# Patient Record
Sex: Male | Born: 1985 | Race: Black or African American | Hispanic: No | Marital: Single | Smoking: Never smoker
Health system: Southern US, Community
[De-identification: ages and names within clinical notes are randomized; demographics above are authoritative.]

## PROBLEM LIST (undated history)

## (undated) DIAGNOSIS — R569 Unspecified convulsions: Secondary | ICD-10-CM

---

## 2010-05-02 ENCOUNTER — Emergency Department (HOSPITAL_COMMUNITY): Admission: EM | Admit: 2010-05-02 | Discharge: 2010-05-02 | Payer: Self-pay | Admitting: Emergency Medicine

## 2010-05-03 ENCOUNTER — Emergency Department (HOSPITAL_COMMUNITY): Admission: EM | Admit: 2010-05-03 | Discharge: 2010-05-03 | Payer: Self-pay | Admitting: Family Medicine

## 2011-01-21 ENCOUNTER — Emergency Department (HOSPITAL_COMMUNITY)
Admission: EM | Admit: 2011-01-21 | Discharge: 2011-01-22 | Disposition: A | Discharge status: Home / Self Care | Payer: Self-pay | Attending: Emergency Medicine | Admitting: Emergency Medicine

## 2011-01-21 ENCOUNTER — Emergency Department (HOSPITAL_COMMUNITY): Payer: Self-pay

## 2011-01-21 DIAGNOSIS — S335XXA Sprain of ligaments of lumbar spine, initial encounter: Secondary | ICD-10-CM | POA: Insufficient documentation

## 2011-01-21 DIAGNOSIS — M5430 Sciatica, unspecified side: Secondary | ICD-10-CM | POA: Insufficient documentation

## 2011-01-21 DIAGNOSIS — X58XXXA Exposure to other specified factors, initial encounter: Secondary | ICD-10-CM | POA: Insufficient documentation

## 2011-01-21 DIAGNOSIS — Y929 Unspecified place or not applicable: Secondary | ICD-10-CM | POA: Insufficient documentation

## 2011-02-14 LAB — URINALYSIS, ROUTINE W REFLEX MICROSCOPIC
Bilirubin Urine: NEGATIVE
Glucose, UA: NEGATIVE mg/dL
Hgb urine dipstick: NEGATIVE
Nitrite: NEGATIVE
Protein, ur: NEGATIVE mg/dL
Specific Gravity, Urine: 1.006 (ref 1.005–1.030)

## 2011-02-14 LAB — CBC
MCV: 82.4 fL (ref 78.0–100.0)
RBC: 5.45 MIL/uL (ref 4.22–5.81)
WBC: 3.5 10*3/uL — ABNORMAL LOW (ref 4.0–10.5)

## 2011-02-14 LAB — DIFFERENTIAL
Basophils Relative: 0 % (ref 0–1)
Neutro Abs: 2.4 10*3/uL (ref 1.7–7.7)
Neutrophils Relative %: 69 % (ref 43–77)

## 2011-02-14 LAB — COMPREHENSIVE METABOLIC PANEL
ALT: 13 U/L (ref 0–53)
Albumin: 4.5 g/dL (ref 3.5–5.2)
Alkaline Phosphatase: 41 U/L (ref 39–117)
BUN: 7 mg/dL (ref 6–23)
CO2: 25 mEq/L (ref 19–32)
Calcium: 9.5 mg/dL (ref 8.4–10.5)
Creatinine, Ser: 0.95 mg/dL (ref 0.4–1.5)
GFR calc non Af Amer: >60 mL/min (ref >60)
Total Bilirubin: 2.7 mg/dL — ABNORMAL HIGH (ref 0.3–1.2)
Total Protein: 7.6 g/dL (ref 6.0–8.3)

## 2012-03-07 ENCOUNTER — Emergency Department (HOSPITAL_BASED_OUTPATIENT_CLINIC_OR_DEPARTMENT_OTHER)
Admission: EM | Admit: 2012-03-07 | Discharge: 2012-03-07 | Disposition: A | Discharge status: Home / Self Care | Payer: Self-pay | Attending: Emergency Medicine | Admitting: Emergency Medicine

## 2012-03-07 ENCOUNTER — Emergency Department (INDEPENDENT_AMBULATORY_CARE_PROVIDER_SITE_OTHER): Payer: Self-pay

## 2012-03-07 ENCOUNTER — Encounter (HOSPITAL_BASED_OUTPATIENT_CLINIC_OR_DEPARTMENT_OTHER): Payer: Self-pay | Admitting: Emergency Medicine

## 2012-03-07 DIAGNOSIS — R079 Chest pain, unspecified: Secondary | ICD-10-CM

## 2012-03-07 DIAGNOSIS — R531 Weakness: Secondary | ICD-10-CM

## 2012-03-07 DIAGNOSIS — R42 Dizziness and giddiness: Secondary | ICD-10-CM | POA: Insufficient documentation

## 2012-03-07 DIAGNOSIS — R0609 Other forms of dyspnea: Secondary | ICD-10-CM

## 2012-03-07 DIAGNOSIS — R5381 Other malaise: Secondary | ICD-10-CM | POA: Insufficient documentation

## 2012-03-07 DIAGNOSIS — R209 Unspecified disturbances of skin sensation: Secondary | ICD-10-CM | POA: Insufficient documentation

## 2012-03-07 LAB — DIFFERENTIAL
Basophils Relative: 0 % (ref 0–1)
Eosinophils Absolute: 0 10*3/uL (ref 0.0–0.7)
Lymphs Abs: 0.6 10*3/uL — ABNORMAL LOW (ref 0.7–4.0)
Monocytes Absolute: 0.5 10*3/uL (ref 0.1–1.0)
Neutrophils Relative %: 87 % — ABNORMAL HIGH (ref 43–77)

## 2012-03-07 LAB — RAPID URINE DRUG SCREEN, HOSP PERFORMED
Benzodiazepines: NOT DETECTED
Cocaine: NOT DETECTED
Tetrahydrocannabinol: NOT DETECTED

## 2012-03-07 LAB — CBC: HCT: 42.2 % (ref 39.0–52.0)

## 2012-03-07 LAB — BASIC METABOLIC PANEL
BUN: 12 mg/dL (ref 6–23)
CO2: 24 mEq/L (ref 19–32)
Calcium: 9.9 mg/dL (ref 8.4–10.5)
Creatinine, Ser: 1 mg/dL (ref 0.50–1.35)
GFR calc non Af Amer: >90 mL/min (ref >90)

## 2012-03-07 MED ORDER — SODIUM CHLORIDE 0.9 % IV BOLUS (SEPSIS)
1000.0000 mL | Freq: Once | INTRAVENOUS | Status: AC
  Administered 2012-03-07: 1000 mL via INTRAVENOUS

## 2012-03-07 NOTE — Discharge Instructions (Signed)
Near-Syncope Near-syncope is sudden weakness, dizziness, or feeling like you might pass out (faint). This may occur when getting up after sitting or while standing for a long period of time. Near-syncope can be caused by a drop in blood pressure. This is a common reaction, but it may occur to a greater degree in people taking medicines to control their blood pressure. Fainting often occurs when the blood pressure or pulse is too low to provide enough blood flow to the brain to keep you conscious. Fainting and near-syncope are not usually due to serious medical problems. However, certain people should be more cautious in the event of near-syncope, including elderly patients, patients with diabetes, and patients with a history of heart conditions (especially irregular rhythms).  CAUSES   Drop in blood pressure.   Physical pain.   Dehydration.   Heat exhaustion.   Emotional distress.   Low blood sugar.   Internal bleeding.   Heart and circulatory problems.   Infections.  SYMPTOMS   Dizziness.   Feeling sick to your stomach (nauseous).   Nearly fainting.   Body numbness.   Turning pale.   Tunnel vision.   Weakness.  HOME CARE INSTRUCTIONS   Lie down right away if you start feeling like you might faint. Breathe deeply and steadily. Wait until all the symptoms have passed. Most of these episodes last only a few minutes. You may feel tired for several hours.   Drink enough fluids to keep your urine clear or pale yellow.   If you are taking blood pressure or heart medicine, get up slowly, taking several minutes to sit and then stand. This can reduce dizziness that is caused by a drop in blood pressure.  SEEK IMMEDIATE MEDICAL CARE IF:   You have a severe headache.   Unusual pain develops in the chest, abdomen, or back.   There is bleeding from the mouth or rectum, or you have black or tarry stool.   An irregular heartbeat or a very rapid pulse develops.   You have  repeated fainting or seizure-like jerking during an episode.   You faint when sitting or lying down.   You develop confusion.   You have difficulty walking.   Severe weakness develops.   Vision problems develop.  MAKE SURE YOU:   Understand these instructions.   Will watch your condition.   Will get help right away if you are not doing well or get worse.  Document Released: 11/14/2005 Document Revised: 11/03/2011 Document Reviewed: 12/31/2010 Exit[employer] Patient Information 2012 [employer]. 

## 2012-03-07 NOTE — ED Notes (Signed)
Pt reports generalized weakness and difficulty breathing that started while at school.

## 2012-03-07 NOTE — ED Provider Notes (Signed)
History     CSN: [id number]  Arrival date & time 03/07/12  1352   First MD Initiated Contact with Patient 03/07/12 1354      Chief Complaint  Patient presents with  . 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  He notes that he started to feel "not comfortable" and felt weak and lightheaded.  He felt like he couldn't sit in his chair.  Because of his lightheadedness he began to cause mother.  He states he then dropped the phone during the phone call and was unable to pick it up for several minutes.  He never lost consciousness.  He is aware of what was going on but felt he couldn't move his hands and that he is tingling in his hands now.  He was able to get to a security guard for help and 911 was called.  Patient denies any pain but notes tingling in his head and his hands.  There Is no focal weakness.  Patient denies any drug use or substance abuse.  He notes a history of anxiety attacks but states this is much more severe than prior anxiety attacks.  He has no chest pain or shortness of breath but as noted in the room to be hyperventilating.  Patient states prior to this episode he had been well.  Patient is a 26 y.o. male presenting with weakness. The history is provided by the patient. No language interpreter was used.  Weakness The primary symptoms include dizziness and paresthesias. Primary symptoms do not include headaches, syncope, loss of consciousness, seizures, visual change, focal weakness, loss of sensation, speech change, memory loss, fever, nausea or vomiting. The symptoms began less than 1 hour ago. The symptoms are improving.  Dizziness also occurs with weakness. Dizziness does not occur with nausea or vomiting.   Additional symptoms include weakness.    History reviewed. No pertinent past medical history.  History  reviewed. No pertinent past surgical history.  No family history on file.  History  Substance Use Topics  . Smoking status: Never Smoker   . Smokeless tobacco: Not on file  . Alcohol Use: No      Review of Systems  Constitutional: Negative for fever and chills.  HENT: Negative.   Eyes: Negative.  Negative for discharge and redness.  Respiratory: Negative.  Negative for cough and shortness of breath.   Cardiovascular: Negative.  Negative for chest pain and syncope.  Gastrointestinal: Negative.  Negative for nausea, vomiting and abdominal pain.  Genitourinary: Negative.  Negative for hematuria.  Musculoskeletal: Negative.  Negative for back pain.  Skin: Negative.  Negative for color change and rash.  Neurological: Positive for dizziness, weakness, light-headedness and paresthesias. Negative for speech change, focal weakness, seizures, loss of consciousness, syncope and headaches.  Hematological: Negative.  Negative for adenopathy.  Psychiatric/Behavioral: Negative.  Negative for memory loss and confusion.  All other systems reviewed and are negative.    Allergies  Review of patient's allergies indicates no known allergies.  Home Medications  No current outpatient prescriptions on file.  BP 156/99  Pulse 82  Temp(Src) 97.9 F (36.6 C) (Oral)  Resp 24  Ht 6' (1.829 m)  Wt 190 lb (86.183 kg)  BMI 25.77 kg/m2  SpO2 100%  Physical Exam  Nursing note and vitals reviewed. Constitutional: He is oriented to person, place, and time. He appears well-developed and well-nourished.  Non-toxic appearance. He does not have a sickly appearance.       Patient appears anxious but is able to speak in simple sentences although he appears to be breathing rapidly but taking large deep inspirations  HENT:  Head: Normocephalic and atraumatic.  Eyes: Conjunctivae, EOM and lids are normal. Pupils are equal, round, and reactive to light. No scleral icterus.  Neck: Trachea normal, normal range  of motion and full passive range of motion without pain. Neck supple. No tracheal deviation present.  Cardiovascular: Normal rate, regular rhythm and normal heart sounds.  Exam reveals no gallop and no friction rub.   No murmur heard. Pulmonary/Chest: Breath sounds normal. No respiratory distress.       Patient is taking rapid deep breaths  Abdominal: Soft. Normal appearance. He exhibits no distension. There is no tenderness. There is no rebound and no CVA tenderness.  Musculoskeletal: Normal range of motion. He exhibits no edema and no tenderness.  Lymphadenopathy:    He has no cervical adenopathy.  Neurological: He is alert and oriented to person, place, and time. He has normal strength.  Skin: Skin is warm, dry and intact. No rash noted. He is not diaphoretic.  Psychiatric: He has a normal mood and affect. His behavior is normal. Judgment and thought content normal.    ED Course  Procedures (including critical care time)  Results for orders placed during the hospital encounter of 03/07/12  CBC      Component Value Range   WBC 8.4  4.0 - 10.5 (K/uL)   RBC 5.40  4.22 - 5.81 (MIL/uL)   Hemoglobin 14.3  13.0 - 17.0 (g/dL)   HCT 16.1  09.6 - 04.5 (%)   MCV 78.1  78.0 - 100.0 (fL)   MCH 26.5  26.0 - 34.0 (pg)   MCHC 33.9  30.0 - 36.0 (g/dL)   RDW 40.9  81.1 - 91.4 (%)   Platelets 239  150 - 400 (K/uL)  DIFFERENTIAL      Component Value Range   Neutrophils Relative 87 (*) 43 - 77 (%)   Neutro Abs 7.3  1.7 - 7.7 (K/uL)   Lymphocytes Relative 7 (*) 12 - 46 (%)   Lymphs Abs 0.6 (*) 0.7 - 4.0 (K/uL)   Monocytes Relative 6  3 - 12 (%)   Monocytes Absolute 0.5  0.1 - 1.0 (K/uL)   Eosinophils Relative 1  0 - 5 (%)   Eosinophils Absolute 0.0  0.0 - 0.7 (K/uL)   Basophils Relative 0  0 - 1 (%)   Basophils Absolute 0.0  0.0 - 0.1 (K/uL)  BASIC METABOLIC PANEL      Component Value Range   Sodium 139  135 - 145 (mEq/L)   Potassium 3.8  3.5 - 5.1 (mEq/L)   Chloride 103  96 - 112 (mEq/L)     CO2 24  19 - 32 (mEq/L)   Glucose, Bld 92  70 - 99 (mg/dL)   BUN 12  6 - 23 (mg/dL)   Creatinine, Ser 7.82  0.50 - 1.35 (mg/dL)   Calcium 9.9  8.4 - 95.6 (mg/dL)   GFR calc non Af Amer >90  >90 (mL/min)   GFR calc Af Amer >90  >90 (mL/min)   Dg Chest 2 View  03/07/2012  *RADIOLOGY REPORT*  Clinical Data: Generalized weakness.  Difficulty breathing.  CHEST - 2 VIEW  Comparison: None.  Findings: Heart size is normal.  Mediastinal shadows are normal. Lungs are clear.  No effusions.  No bony abnormalities.  IMPRESSION: Normal chest  Original Report Authenticated By: Thomasenia Sales, M.D.      Date: 03/07/2012  Rate: 83  Rhythm: normal sinus rhythm  QRS Axis: normal  Intervals: normal  ST/T Wave abnormalities: normal  Conduction Disutrbances:Incomplete right bundle branch block  Narrative Interpretation:   Old EKG Reviewed: none available     MDM  The patient with unclear etiology for his generalized weakness.  He denies any drug use or alcohol use at this time.  He has normal laboratory studies, normal x-ray and a normal EKG here today.  After 1 L of fluids in a short period of observation the patient feels completely better and improved and appears well at this time.  Patient may have had a vasovagal episode although is unclear.  There may be a possible psychiatric component to this as the patient does note some history of anxiety and anxiety attacks.  As the patient is otherwise healthy has normal labs, vital signs and EKG and appears well at this time without symptoms consistent for dysrhythmia, seizure or other acute neurologic deficit I feel the patient is safe for discharge home.        Nat Christen, MD 03/07/12 [phone]

## 2012-03-07 NOTE — ED Notes (Signed)
Pt attempting to void at present.

## 2013-03-16 ENCOUNTER — Emergency Department (HOSPITAL_COMMUNITY)
Admission: EM | Admit: 2013-03-16 | Discharge: 2013-03-16 | Disposition: A | Discharge status: Home / Self Care | Payer: Self-pay | Source: Home / Self Care | Attending: Emergency Medicine | Admitting: Emergency Medicine

## 2013-03-16 ENCOUNTER — Encounter (HOSPITAL_COMMUNITY): Payer: Self-pay | Admitting: *Deleted

## 2013-03-16 DIAGNOSIS — M26629 Arthralgia of temporomandibular joint, unspecified side: Secondary | ICD-10-CM

## 2013-03-16 DIAGNOSIS — R51 Headache: Secondary | ICD-10-CM

## 2013-03-16 MED ORDER — TRAMADOL HCL 50 MG PO TABS: 50.0000 mg | ORAL_TABLET | Freq: Four times a day (QID) | ORAL | Status: DC | PRN

## 2013-03-16 MED ORDER — DIAZEPAM 5 MG PO TABS: 5.0000 mg | ORAL_TABLET | Freq: Three times a day (TID) | ORAL | Status: DC | PRN

## 2013-03-16 MED ORDER — KETOROLAC TROMETHAMINE 30 MG/ML IJ SOLN
30.0000 mg | Freq: Once | INTRAMUSCULAR | Status: AC
  Administered 2013-03-16: 30 mg via INTRAMUSCULAR

## 2013-03-16 MED ORDER — METHYLPREDNISOLONE 4 MG PO KIT: PACK | ORAL | Status: DC

## 2013-03-16 MED ORDER — KETOROLAC TROMETHAMINE 30 MG/ML IJ SOLN
INTRAMUSCULAR | Status: AC
  Filled 2013-03-16: qty 1

## 2013-03-16 NOTE — ED Provider Notes (Signed)
Medical screening examination/treatment/procedure(s) were performed by non-physician practitioner and as supervising physician I was immediately available for consultation/collaboration.  Leslee Home, M.D.  Reuben Likes, MD 03/16/13 [phone]

## 2013-03-16 NOTE — ED Provider Notes (Signed)
History     CSN: [id number]  Arrival date & time 03/16/13  1328   First MD Initiated Contact with Patient 03/16/13 1329      Chief Complaint  Patient presents with  . Dental Pain    (Consider location/radiation/quality/duration/timing/severity/associated sxs/prior treatment) HPI Comments: 27 year old male presents with right facial pain for one week. The pain is located primarily over the TMJ and radiates high to the right temple anteriorly to the maxilla and toward the chin. He states that drinking cool liquids or hot liquids makes the pain worse and sometimes compresses of extreme temperatures can make it worse. Opening the jaw wide produces pain over the TMJ which radiates to the areas mentioned above and he often feels a clicking or popping in the right TMJ with opening of the jaw. The pain is also increased with side to side motion of the mandible and states that once the mandible is positioned to the right it becomes "caught" in that position and painful to move it to a neutral position or left lateral. Denies fever, chills, sore throat, earache or chest pain. 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   History reviewed. No pertinent past medical history.  History reviewed. No pertinent past surgical history.  No family history on file.  History  Substance Use Topics  . Smoking status: Never Smoker   . Smokeless tobacco: Not on file  . Alcohol Use: No      Review of Systems  Constitutional: Negative.   HENT: Negative for hearing loss, ear pain, nosebleeds, sore throat, facial swelling, rhinorrhea, trouble swallowing, neck pain, neck stiffness, dental problem and ear discharge.   Respiratory: Negative.   Cardiovascular: Negative.   Gastrointestinal: Negative.   Skin: Negative.   Neurological: Negative for facial asymmetry, speech difficulty and light-headedness.    Allergies  Review of patient's allergies  indicates no known allergies.  Home Medications   Current Outpatient Rx  Name  Route  Sig  Dispense  Refill  . diazepam (VALIUM) 5 MG tablet   Oral   Take 1 tablet (5 mg total) by mouth every 8 (eight) hours as needed for anxiety. Take one tablet 1 hour before hs for discomfort and sleep   12 tablet   0   . methylPREDNISolone (MEDROL DOSEPAK) 4 MG tablet      follow package directions   21 tablet   0   . traMADol (ULTRAM) 50 MG tablet   Oral   Take 1 tablet (50 mg total) by mouth every 6 (six) hours as needed for pain.   20 tablet   0     BP 157/81  Pulse 64  Temp(Src) 99.1 F (37.3 C)  Resp 16  SpO2 100%  Physical Exam  Nursing note and vitals reviewed. Constitutional: He is oriented to person, place, and time. He appears well-developed and well-nourished. No distress.  HENT:  Head: Normocephalic and atraumatic.  Right Ear: External ear normal.  Left Ear: External ear normal.  Mouth/Throat: Oropharynx is clear and moist.  Oropharynx is clear and moist. No exudates. No reproducible dental tenderness. No gingival erythema, swelling or abscess formation. No buccal erythema or swelling. Swallowing reflex intact. Soft palate rises symmetrically. Positive for marked pterygoid muscle tenderness The greatest tenderness externally is palpation at the TMJ.  Eyes: EOM are normal. Left eye exhibits no discharge.  Neck: Normal range of motion. Neck supple.  Cardiovascular: Normal rate.   Pulmonary/Chest: Effort  normal.  Musculoskeletal: Normal range of motion. He exhibits no edema.  Neurological: He is alert and oriented to person, place, and time. No cranial nerve deficit.  Skin: Skin is warm and dry.  Psychiatric: He has a normal mood and affect.    ED Course  Procedures (including critical care time)  Labs Reviewed - No data to display No results found.   1. Right facial pain   2. TMJ arthralgia       MDM  Differential includes TMJ arthralgia and  inflammation, trigeminal neuralgia or occult infection. I do not see any signs of infection or lymphadenitis. No signs of dental infection or pain. Toradol 30 mg IM Medrol Dosepak for 7 days Valium 5 mg one hour prior to bedtime when necessary discomfort in sleep Tramadol 50 mg every 6 hours when necessary pain Apply cold packs over the right jaw. For any new symptoms problems or worsening such as fever, increased pain may return.        Hayden Rasmussen, NP 03/16/13 1620

## 2013-03-16 NOTE — ED Notes (Signed)
Pt  Reports  r  Upper  Toothache     Since  Last  Night          Pt  Has  Not  Seen  A  Dentist  Yet             He  Is  Sitting  Upright on  Exam table          Speaking  In  Complete  sentances

## 2013-03-18 NOTE — ED Notes (Signed)
Pharmacy called requesting change in medication.  Methylprednisolone changed to sterapred 6day pack, take as directed.  Order per Hayden Rasmussen np/order read back.  Order provided topharmacy

## 2013-05-27 ENCOUNTER — Emergency Department (HOSPITAL_COMMUNITY)
Admission: EM | Admit: 2013-05-27 | Discharge: 2013-05-27 | Disposition: A | Discharge status: Home / Self Care | Payer: Self-pay | Attending: Emergency Medicine | Admitting: Emergency Medicine

## 2013-05-27 ENCOUNTER — Emergency Department (HOSPITAL_COMMUNITY): Payer: Self-pay

## 2013-05-27 ENCOUNTER — Encounter (HOSPITAL_COMMUNITY): Payer: Self-pay | Admitting: Emergency Medicine

## 2013-05-27 DIAGNOSIS — S8990XA Unspecified injury of unspecified lower leg, initial encounter: Secondary | ICD-10-CM | POA: Insufficient documentation

## 2013-05-27 DIAGNOSIS — Y929 Unspecified place or not applicable: Secondary | ICD-10-CM | POA: Insufficient documentation

## 2013-05-27 DIAGNOSIS — S99922A Unspecified injury of left foot, initial encounter: Secondary | ICD-10-CM

## 2013-05-27 DIAGNOSIS — X500XXA Overexertion from strenuous movement or load, initial encounter: Secondary | ICD-10-CM | POA: Insufficient documentation

## 2013-05-27 DIAGNOSIS — Y939 Activity, unspecified: Secondary | ICD-10-CM | POA: Insufficient documentation

## 2013-05-27 MED ORDER — HYDROCODONE-ACETAMINOPHEN 5-325 MG PO TABS
1.0000 | ORAL_TABLET | Freq: Once | ORAL | Status: AC
  Administered 2013-05-27: 1 via ORAL
  Filled 2013-05-27: qty 1

## 2013-05-27 MED ORDER — IBUPROFEN 800 MG PO TABS: 800.0000 mg | ORAL_TABLET | Freq: Three times a day (TID) | ORAL | Status: DC | PRN

## 2013-05-27 MED ORDER — HYDROCODONE-ACETAMINOPHEN 5-325 MG PO TABS: 1.0000 | ORAL_TABLET | ORAL | Status: DC | PRN

## 2013-05-27 MED ORDER — IBUPROFEN 400 MG PO TABS
800.0000 mg | ORAL_TABLET | Freq: Once | ORAL | Status: AC
  Administered 2013-05-27: 800 mg via ORAL
  Filled 2013-05-27: qty 2

## 2013-05-27 NOTE — ED Notes (Signed)
Pt c/o left ankle pain today after tripping down stairs; pt sts heard pop

## 2013-05-27 NOTE — Progress Notes (Signed)
Orthopedic Tech Progress Note Patient Details:  George Wells 1985-12-27 [id number] Left Ankle ASO applied with care instructions. Tolerated well.  Ortho Devices Type of Ortho Device: ASO Ortho Device/Splint Location: Left Ortho Device/Splint Interventions: Application   [name] 05/27/2013, 5:09 PM

## 2013-05-27 NOTE — ED Provider Notes (Signed)
This chart was scribed for George Wells, a non-physician practitioner working with George Wells. George Payor, MD by George Wells, ED Scribe. This patient was seen in room TR07C/TR07C and the patient's care was started at 1609.     History    CSN: [id number] Arrival date & time 05/27/13  1249  First MD Initiated Contact with Patient 05/27/13 1601     Chief Complaint  Patient presents with  . Ankle Pain   (Consider location/radiation/quality/duration/timing/severity/associated sxs/prior Treatment) The history is provided by the patient.   HPI Comments: George Wells is a 27 y.o. male who presents to the Emergency Department complaining of constant moderate left ankle pain onset today after tripping down stairs. Reports hearing a "popping" sound. Denies any other injuries, and numbness. Reports symptoms are aggravated with weight bearing. Denies taking any OTC medications PTA to alleviate symptoms.     History reviewed. No pertinent past medical history. History reviewed. No pertinent past surgical history. History reviewed. No pertinent family history. History  Substance Use Topics  . Smoking status: Never Smoker   . Smokeless tobacco: Not on file  . Alcohol Use: No    Review of Systems  Constitutional: Negative for fever.  Musculoskeletal: Positive for arthralgias (left ankle pain ).  All other systems reviewed and are negative.   A complete 10 system review of systems was obtained and all systems are negative except as noted in the HPI and PMH.    Allergies  Review of patient's allergies indicates no known allergies.  Home Medications   Current Outpatient Rx  Name  Route  Sig  Dispense  Refill  . traMADol (ULTRAM) 50 MG tablet   Oral   Take 1 tablet (50 mg total) by mouth every 6 (six) hours as needed for pain.   20 tablet   0    BP 111/69  Pulse 98  Temp(Src) 97.6 F (36.4 C) (Oral)  Resp 18  SpO2 100% Physical Exam  Nursing note and vitals  reviewed. Constitutional: He is oriented to person, place, and time. He appears well-developed and well-nourished. No distress.  HENT:  Head: Normocephalic and atraumatic.  Eyes: EOM are normal.  Neck: Neck supple. No tracheal deviation present.  Cardiovascular: Intact distal pulses and normal pulses.   Pulses:      Dorsalis pedis pulses are 2+ on the left side.  Cap refill less than 3 seconds  Pulmonary/Chest: Effort normal. No respiratory distress.  Musculoskeletal:       Left ankle: He exhibits decreased range of motion (secondary to pain ). He exhibits no swelling, no ecchymosis and no deformity. Tenderness. Achilles tendon normal. Achilles tendon exhibits no pain and no defect.       Left foot: He exhibits tenderness. He exhibits no bony tenderness, no swelling and no deformity.  Neurological: He is alert and oriented to person, place, and time.  Skin: Skin is warm and dry.  Psychiatric: He has a normal mood and affect. His behavior is normal.    ED Course  Procedures (including critical care time) Medications - No data to display  Labs Reviewed - No data to display Dg Ankle Complete Left  05/27/2013   *RADIOLOGY REPORT*  Clinical Data: Left foot and ankle pain after falling today.  LEFT ANKLE COMPLETE - 3+ VIEW  Comparison: None.  Findings: The mineralization and alignment are normal.  There is no evidence of acute fracture or dislocation.  Joint spaces are maintained.  There is no focal soft tissue swelling.  Endosteal sclerosis is noted laterally in the distal tibial metaphysis, consistent with an incidental a fibroxanthoma.  IMPRESSION: No acute osseous findings.  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   Original Report Authenticated By: George Wells, M.D.   Dg Foot Complete Left  05/27/2013   *RADIOLOGY REPORT*  Clinical Data: Left foot and ankle pain after falling today  LEFT FOOT - COMPLETE 3+ VIEW  Comparison: None.  Findings: The mineralization and alignment are  normal.  There is no evidence of acute fracture or dislocation.  No focal soft tissue abnormalities are identified.  A fibroxanthoma in the distal tibia and os peroneii noted.  IMPRESSION: No acute osseous findings.   Original Report Authenticated By: George Wells, M.D.   No diagnosis found. 1. Left foot injury MDM  Uncomplicated, non-fracture injury to left foot. No obvious tendon dysfunction however exam limited by pain. Refer to Ortho.   I personally performed the services described in this documentation, which was scribed in my presence. The recorded information has been reviewed and is accurate.     George Hooker, PA-C 05/27/13 1641

## 2013-05-28 NOTE — ED Provider Notes (Signed)
Medical screening examination/treatment/procedure(s) were performed by non-physician practitioner and as supervising physician I was immediately available for consultation/collaboration.  [name]. Rubin Payor, MD 05/28/13 2440

## 2015-08-31 ENCOUNTER — Emergency Department (HOSPITAL_COMMUNITY)
Admission: EM | Admit: 2015-08-31 | Discharge: 2015-08-31 | Disposition: A | Discharge status: Home / Self Care | Payer: Self-pay | Source: Home / Self Care

## 2015-08-31 ENCOUNTER — Encounter (HOSPITAL_COMMUNITY): Payer: Self-pay | Admitting: Emergency Medicine

## 2015-08-31 DIAGNOSIS — M7582 Other shoulder lesions, left shoulder: Secondary | ICD-10-CM

## 2015-08-31 MED ORDER — KETOROLAC TROMETHAMINE 60 MG/2ML IM SOLN
60.0000 mg | Freq: Once | INTRAMUSCULAR | Status: AC
  Administered 2015-08-31: 60 mg via INTRAMUSCULAR

## 2015-08-31 MED ORDER — NAPROXEN 375 MG PO TABS: 375.0000 mg | ORAL_TABLET | Freq: Two times a day (BID) | ORAL | Status: DC

## 2015-08-31 MED ORDER — KETOROLAC TROMETHAMINE 60 MG/2ML IM SOLN
INTRAMUSCULAR | Status: AC
  Filled 2015-08-31: qty 2

## 2015-08-31 NOTE — ED Notes (Signed)
Pt has had some reduced ROM in his left shoulder that has been going on for the last two days.  He denies any injury to the shoulder, but states he carries his bike up a flight of stairs and carries it with his left arm.  He has more pain with certain movements, but he has a constant 6/10 pain at rest.

## 2015-08-31 NOTE — ED Provider Notes (Signed)
CSN: [id number]     Arrival date & time 08/31/15  1304 History   None    Chief Complaint  Patient presents with  . Shoulder Pain   (Consider location/radiation/quality/duration/timing/severity/associated sxs/prior Treatment) Patient is a 29 y.o. male presenting with shoulder pain. The history is provided by the patient.  Shoulder Pain Location:  Shoulder Time since incident:  0 days Injury: no   Shoulder location:  L shoulder Pain details:    Quality:  Aching   Radiates to:  L shoulder   Severity:  Moderate   Onset quality:  Sudden   Duration:  2 days Chronicity:  New Handedness:  Ambidextrous Dislocation: no   Foreign body present:  No foreign bodies Relieved by:  Nothing Worsened by:  Movement   History reviewed. No pertinent past medical history. History reviewed. No pertinent past surgical history. History reviewed. No pertinent family history. Social History  Substance Use Topics  . Smoking status: Never Smoker   . Smokeless tobacco: None  . Alcohol Use: No    Review of Systems  Constitutional: Negative.   HENT: Negative.   Eyes: Negative.   Respiratory: Negative.   Cardiovascular: Negative.   Gastrointestinal: Negative.   Endocrine: Negative.   Genitourinary: Negative.   Musculoskeletal: Positive for arthralgias.       Left shoulder pain  Skin: Negative.   Allergic/Immunologic: Negative.   Neurological: Negative.   Hematological: Negative.   Psychiatric/Behavioral: Negative.     Allergies  Review of patient's allergies indicates no known allergies.  Home Medications   Prior to Admission medications   Medication Sig Start Date End Date Taking? Authorizing Provider  HYDROcodone-acetaminophen (NORCO/VICODIN) 5-325 MG per tablet Take 1 tablet by mouth every 4 (four) hours as needed for pain. 05/27/13   Elpidio Anis, PA-C  ibuprofen (ADVIL,MOTRIN) 800 MG tablet Take 1 tablet (800 mg total) by mouth every 8 (eight) hours as needed for pain. 05/27/13    Elpidio Anis, PA-C  traMADol (ULTRAM) 50 MG tablet Take 1 tablet (50 mg total) by mouth every 6 (six) hours as needed for pain. 03/16/13   Hayden Rasmussen, NP   Meds Ordered and Administered this Visit  Medications - No data to display  BP 129/89 mmHg  Pulse 50  Temp(Src) 98.2 F (36.8 C) (Oral)  Resp 12  SpO2 100% No data found.   Physical Exam  Constitutional: He appears well-developed and well-nourished.  HENT:  Head: Normocephalic and atraumatic.  Eyes: Conjunctivae and EOM are normal. Pupils are equal, round, and reactive to light.  Neck: Normal range of motion. Neck supple.  Cardiovascular: Normal rate and regular rhythm.   Pulmonary/Chest: Effort normal and breath sounds normal.  Abdominal: Soft. Bowel sounds are normal.  Musculoskeletal:  TTP left shoulder with positive NEER and Hawkins. Decreased ROM with internal and external rotation and Decreased ROM with circumduction and with abduction.    ED Course  Procedures (including critical care time)  Labs Review Labs Reviewed - No data to display  Imaging Review No results found.   Visual Acuity Review  Right Eye Distance:   Left Eye Distance:   Bilateral Distance:    Right Eye Near:   Left Eye Near:    Bilateral Near:         MDM     Deatra Canter, FNP 08/31/15 1547

## 2015-08-31 NOTE — Discharge Instructions (Signed)
Biceps Tendon Tendinitis (Proximal) and Tenosynovitis with Rehab Tendonitis and tenosynovitis involve inflammation of the tendon and the tendon lining (sheath). The proximal biceps tendon is vulnerable to tendonitis and tenosynovitis, which causes pain and discomfort in the front of the shoulder and upper arm. The tendon lining secretes a fluid that helps lubricate the tendon, allowing for proper function without pain. When the tendon and its lining become inflamed, the tendon can no longer glide smoothly, causing pain. The proximal biceps tendon connects the biceps muscle to two bones of the shoulder. It is important for proper function of the elbow and turning the palm upward (supination) using the wrist. Proximal biceps tendon tendinitis may include a grade 1 or 2 strain of the tendon. Grade 1 strains involve a slight pull of the tendon without signs of tearing and no observed tendon lengthening. There is also no loss of strength. Grade 2 strains involve small tears in the tendon fibers. The tendon or muscle is stretched and strength is usually decreased.  SYMPTOMS   Pain, tenderness, swelling, warmth, or redness over the front of the shoulder.  Pain that gets worse with shoulder and elbow use, especially against resistance.  Limited motion of the shoulder or elbow.  Crackling sound (crepitation) when the tendon or shoulder is moved or touched. CAUSES  The symptoms of biceps tendonitis are due to inflammation of the tendon. Inflammation may be caused by:  Strain from sudden increase in amount or intensity of activity.  Direct blow or injury to the elbow (uncommon).  Overuse or repetitive elbow bending or wrist rotation, particularly when turning the palm up, or with elbow hyperextension. RISK INCREASES WITH:  Sports that involve contact or overhead arm activity (throwing sports, gymnastics, weightlifting, bodybuilding, rock climbing).  Heavy labor.  Poor strength and  flexibility.  Failure to warm up properly before activity. PREVENTION  Warm up and stretch properly before activity.  Allow time for recovery between activities.  Maintain physical fitness:  Strength, flexibility, and endurance.  Cardiovascular fitness.  Learn and use proper exercise technique. PROGNOSIS  With proper treatment, proximal biceps tendon tendonitis and tenosynovitis is usually curable within 6 weeks. Healing is usually quicker if the cause was a direct blow, not overuse.  RELATED COMPLICATIONS   Longer healing time if not properly treated or if not given enough time to heal.  Chronically inflamed tendon that causes persistent pain with activity, that may progress to constant pain and potentially rupture of the tendon.  Recurring symptoms, especially if activity is resumed too soon or with overuse, a direct blow, or use of poor exercise technique. TREATMENT Treatment first involves ice and medicine, to reduce pain and inflammation. It is helpful to modify activities that cause pain, to reduce the chances of causing the condition to get worse. Strengthening and stretching exercises should be performed to promote proper use of the muscles of the shoulder. These exercises may be performed at home or with a therapist. Other treatments may be given such as ultrasound or heat therapy. A corticosteroid injection may be recommended to help reduce inflammation of the tendon lining. Surgery is usually not necessary. Sometimes, if symptoms last for greater than 6 months, surgery will be advised to detach the tendon and re-insert it into the arm bone. Surgery to correct other shoulder problems that may be contributing to tendinitis may be advised before surgery for the tendinitis itself.  MEDICATION  If pain medicine is needed, nonsteroidal anti-inflammatory medicines (aspirin and ibuprofen), or other minor pain relievers (  acetaminophen), are often advised.  Do not take pain medicine  for 7 days before surgery.  Prescription pain relievers may be given if your caregiver thinks they are needed. Use only as directed and only as much as you need.  Corticosteroid injections may be given. These injections should only be used on the most severe cases, as one can only receive a limited number of them. HEAT AND COLD   Cold treatment (icing) should be applied for 10 to 15 minutes every 2 to 3 hours for inflammation and pain, and immediately after activity that aggravates your symptoms. Use ice packs or an ice massage.  Heat treatment may be used before performing stretching and strengthening activities prescribed by your caregiver, physical therapist, or athletic trainer. Use a heat pack or a warm water soak. SEEK MEDICAL CARE IF:   Symptoms get worse or do not improve in 2 weeks, despite treatment.  New, unexplained symptoms develop. (Drugs used in treatment may produce side effects.) EXERCISES RANGE OF MOTION (ROM) AND EXERCISES - Biceps Tendon (Proximal) and Tenosynovitis These exercises may help you when beginning to rehabilitate your injury. Your symptoms may go away with or without further involvement from your physician, physical therapist, or athletic trainer. While completing these exercises, remember:   Restoring tissue flexibility helps normal motion to return to the joints. This allows healthier, less painful movement and activity.  An effective stretch should be held for at least 30 seconds.  A stretch should never be painful. You should only feel a gentle lengthening or release in the stretched tissue. STRETCH - Flexion, Standing  Stand with good posture. With an underhand grip on your right / left hand and an overhand grip on the opposite hand, grasp a broomstick or cane so that your hands are a little more than shoulder width apart.  Keeping your right / left elbow straight and shoulder muscles relaxed, push the stick with your opposite hand to raise your right  / left arm in front of your body and then overhead. Raise your arm until you feel a stretch in your right / left shoulder, but before you have increased shoulder pain.  Try to avoid shrugging your right / left shoulder as your arm rises, by keeping your shoulder blade tucked down and toward your mid-back spine. Hold for __________ seconds.  Slowly return to the starting position. Repeat __________ times. Complete this exercise __________ times per day. STRETCH - Abduction, Supine  Lie on your back. With an underhand grip on your right / left hand and an overhand grip on the opposite hand, grasp a broomstick or cane so that your hands are a little more than shoulder width apart.  Keeping your right / left elbow straight and shoulder muscles relaxed, push the stick with your opposite hand to raise your right / left arm out to the side of your body and then overhead. Raise your arm until you feel a stretch in your right / left shoulder, but before you have increased shoulder pain.  Try to avoid shrugging your right / left shoulder as your arm rises, by keeping your shoulder blade tucked down and toward your mid-back spine. Hold for __________ seconds.  Slowly return to the starting position. Repeat __________ times. Complete this exercise __________ times per day. ROM - Flexion, Active-Assisted  Lie on your back. You may bend your knees for comfort.  Grasp a broomstick or cane so your hands are about shoulder width apart. Your right / left hand should   grip the end of the stick so that your hand is positioned "thumbs-up," as if you were about to shake hands.  Using your healthy arm to lead, raise your right / left arm overhead until you feel a gentle stretch in your shoulder. Hold for __________ seconds.  Use the stick to assist in returning your right / left arm to its starting position. Repeat __________ times. Complete this exercise __________ times per day.  STRETCH - Flexion, Standing    Stand facing a wall. Walk your right / left fingers up the wall until you feel a moderate stretch in your shoulder. As your hand gets higher, you may need to step closer to the wall or use a door frame to walk through.  Try to avoid shrugging your right / left shoulder as your arm rises, by keeping your shoulder blade tucked down and toward your mid-back spine.  Hold for __________ seconds. Use your other hand, if needed, to ease out of the stretch and return to the starting position. Repeat __________ times. Complete this exercise __________ times per day.  ROM - Internal Rotation   Using underhand grips, grasp a stick behind your back with both hands.  While standing upright with good posture, slide the stick up your back until you feel a mild stretch in the front of your shoulder.  Hold for __________ seconds. Slowly return to your starting position. Repeat __________ times. Complete this exercise __________ times per day.  STRETCH - Internal Rotation  Place your right / left hand behind your back, palm-up.  Throw a towel or belt over your opposite shoulder. Grasp the towel with your right / left hand.  While keeping an upright posture, gently pull up on the towel until you feel a stretch in the front of your right / left shoulder.  Avoid shrugging your right / left shoulder as your arm rises, by keeping your shoulder blade tucked down and toward your mid-back spine.  Hold for __________ seconds. Release the stretch by lowering your opposite hand. Repeat __________ times. Complete this exercise __________ times per day. STRENGTHENING EXERCISES - Biceps Tendon Tendinitis (Proximal) and Tenosynovitis These exercises may help you regain your strength after your physician has discontinued your restraint in a cast or brace. They may resolve your symptoms with or without further involvement from your physician, physical therapist or athletic trainer. While completing these exercises,  remember:   Muscles can gain both the endurance and the strength needed for everyday activities through controlled exercises.  Complete these exercises as instructed by your physician, physical therapist or athletic trainer. Increase the resistance and repetitions only as guided.  You may experience muscle soreness or fatigue, but the pain or discomfort you are trying to eliminate should never worsen during these exercises. If this pain does get worse, stop and make sure you are following the directions exactly. If the pain is still present after adjustments, discontinue the exercise until you can discuss the trouble with your caregiver. STRENGTH - Elbow Flexors, Isometric  Stand or sit upright on a firm surface. Place your right / left arm so that your hand is palm-up and at the height of your waist.  Place your opposite hand on top of your forearm. Gently push down as your right / left arm resists. Push as hard as you can with both arms, without causing any pain or movement at your right / left elbow. Hold this stationary position for __________ seconds.  Gradually release the tension in both   arms. Allow your muscles to relax completely before repeating. Repeat __________ times. Complete this exercise __________ times per day. STRENGTH - Shoulder Flexion, Isometric  With good posture and facing a wall, stand or sit about 4-6 inches away.  Keeping your right / left elbow straight, gently press the top of your fist into the wall. Increase the pressure gradually until you are pressing as hard as you can, without shrugging your shoulder or increasing any shoulder discomfort.  Hold for __________ seconds.  Release the tension slowly. Relax your shoulder muscles completely before you start the next repetition. Repeat __________ times. Complete this exercise __________ times per day.  STRENGTH - Elbow Flexors, Supinated  With good posture, stand or sit on a firm chair without armrests. Allow  your right / left arm to rest at your side with your palm facing forward.  Holding a __________ weight, or gripping a rubber exercise band or tubing,  bring your hand toward your shoulder.  Allow your muscles to control the resistance as your hand returns to your side. Repeat __________ times. Complete this exercise __________ times per day.  STRENGTH - Shoulder Flexion  Stand or sit with good posture. Grasp a __________ weight, or an exercise band or tubing, so that your hand is "thumbs-up," like when you shake hands.  Slowly lift your right / left arm as far as you can, without increasing any shoulder pain. At first, many people can only raise their hand to shoulder height.  Avoid shrugging your right / left shoulder as your arm rises, by keeping your shoulder blade tucked down and toward your mid-back spine.  Hold for __________ seconds. Control the descent of your hand as you slowly return to your starting position. Repeat __________ times. Complete this exercise __________ times per day. Document Released: 11/14/2005 Document Revised: 02/06/2012 Document Reviewed: 02/26/2009 [employer] Patient Information 2015 [employer]. This information is not intended to replace advice given to you by your health care provider. Make sure you discuss any questions you have with your health care provider.  

## 2017-05-04 ENCOUNTER — Emergency Department (HOSPITAL_COMMUNITY)
Admission: EM | Admit: 2017-05-04 | Discharge: 2017-05-04 | Disposition: A | Discharge status: Home / Self Care | Payer: Self-pay | Attending: Emergency Medicine | Admitting: Emergency Medicine

## 2017-05-04 DIAGNOSIS — K047 Periapical abscess without sinus: Secondary | ICD-10-CM | POA: Insufficient documentation

## 2017-05-04 MED ORDER — BUPIVACAINE-EPINEPHRINE (PF) 0.5% -1:200000 IJ SOLN
1.8000 mL | Freq: Once | INTRAMUSCULAR | Status: AC
  Administered 2017-05-04: 1.8 mL
  Filled 2017-05-04: qty 1.8

## 2017-05-04 MED ORDER — PENICILLIN V POTASSIUM 500 MG PO TABS: 500.0000 mg | ORAL_TABLET | Freq: Four times a day (QID) | ORAL | 0 refills | Status: AC

## 2017-05-04 MED ORDER — BUPIVACAINE-EPINEPHRINE (PF) 0.5% -1:200000 IJ SOLN
10.0000 mL | Freq: Once | INTRAMUSCULAR | Status: DC
  Filled 2017-05-04: qty 10

## 2017-05-04 MED ORDER — LIDOCAINE HCL (PF) 1 % IJ SOLN
INTRAMUSCULAR | Status: AC
  Filled 2017-05-04: qty 5

## 2017-05-04 MED ORDER — IBUPROFEN 600 MG PO TABS: 600.0000 mg | ORAL_TABLET | Freq: Four times a day (QID) | ORAL | 0 refills | Status: DC | PRN

## 2017-05-04 MED ORDER — LIDOCAINE-EPINEPHRINE (PF) 2 %-1:200000 IJ SOLN
10.0000 mL | Freq: Once | INTRAMUSCULAR | Status: AC
  Administered 2017-05-04: 10 mL via INTRADERMAL

## 2017-05-04 MED ORDER — LIDOCAINE HCL (CARDIAC) 20 MG/ML IV SOLN
INTRAVENOUS | Status: AC
  Filled 2017-05-04: qty 5

## 2017-05-04 MED ORDER — HYDROCODONE-ACETAMINOPHEN 5-325 MG PO TABS: 1.0000 | ORAL_TABLET | Freq: Four times a day (QID) | ORAL | 0 refills | Status: DC | PRN

## 2017-05-04 NOTE — ED Notes (Signed)
See EDP secondary assessment.  

## 2017-05-04 NOTE — ED Notes (Signed)
Patient called x 4 with no answer.  All areas of waiting room and outside checked.  Unable to locate patient.

## 2017-05-04 NOTE — ED Triage Notes (Signed)
Pt reports lower right dental pain and swelling x3 days.

## 2017-05-04 NOTE — ED Provider Notes (Signed)
[facility]-EMERGENCY DEPT Provider Note    By signing my name below, I, Rosana Fretana Waskiewicz, attest that this documentation has been prepared under the direction and in the presence of non-physician practitioner, Kirichencko, Damarie Schoolfield, PA-C. Electronically Signed: Rosana Fretana Waskiewicz, ED Scribe. 05/04/17. 12:34 PM.  History   Chief Complaint Chief Complaint  Patient presents with  . Dental Pain    The history is provided by the patient and medical records. No language interpreter was used.    HPI Comments: George Wells is a 31 y.o. male who presents to the Emergency Department complaining of a constant, gradually worsening area of pain and swelling to the right lower mouth onset 2 days ago. Pt states pain is exacerbated by opening the mouth. Pt reports associated chills, trouble breathing out of his mouth, and facial swelling. Pt has tried warm compresses, and cleansing with salt water and peroxide with no relief of his symptoms. Pt denies fever, difficulty swallowing or any other complaints at this time.   No past medical history on file.  There are no active problems to display for this patient.   No past surgical history on file.     Home Medications    Prior to Admission medications   Medication Sig Start Date End Date Taking? Authorizing Provider  HYDROcodone-acetaminophen (NORCO/VICODIN) 5-325 MG per tablet Take 1 tablet by mouth every 4 (four) hours as needed for pain. 05/27/13   Elpidio AnisUpstill, Shari, PA-C  ibuprofen (ADVIL,MOTRIN) 800 MG tablet Take 1 tablet (800 mg total) by mouth every 8 (eight) hours as needed for pain. 05/27/13   Elpidio AnisUpstill, Shari, PA-C  naproxen (NAPROSYN) 375 MG tablet Take 1 tablet (375 mg total) by mouth 2 (two) times daily. 08/31/15   Deatra Canterxford, William J, FNP  traMADol (ULTRAM) 50 MG tablet Take 1 tablet (50 mg total) by mouth every 6 (six) hours as needed for pain. 03/16/13   Hayden RasmussenMabe, David, NP    Family History No family history on file.  Social History Social History    Substance Use Topics  . Smoking status: Never Smoker  . Smokeless tobacco: Not on file  . Alcohol use No     Allergies   Patient has no known allergies.   Review of Systems Review of Systems  Constitutional: Positive for chills. Negative for fever.  HENT: Positive for dental problem and facial swelling. Negative for trouble swallowing.      Physical Exam Updated Vital Signs BP (!) 143/96 (BP Location: Right Arm)   Pulse 81   Temp 98.5 F (36.9 C)   Resp 17   SpO2 100%   Physical Exam  Constitutional: He is oriented to person, place, and time. He appears well-developed and well-nourished.  HENT:  Head: Normocephalic and atraumatic.  Right lower facial swelling noted. There is multiple decayed teeth specifically right upper second and third molars, right lower first, second, third molars. There is surrounding gum swelling and visible abscess to the right lower gum. No trismus, no swelling under the tongue  Neck: Normal range of motion.  Cardiovascular: Normal rate.   Pulmonary/Chest: Effort normal.  Musculoskeletal: Normal range of motion.  Neurological: He is alert and oriented to person, place, and time.  Skin: Skin is warm and dry.  Psychiatric: He has a normal mood and affect. His behavior is normal.  Nursing note and vitals reviewed.    ED Treatments / Results  DIAGNOSTIC STUDIES: Oxygen Saturation is 100% on RA, normal by my interpretation.   COORDINATION OF CARE: 12:31 PM- Pt verbalizes  understanding and agrees to plan including I&D of the abscess and prescription for antibiotics.  Medications - No data to display  Labs (all labs ordered are listed, but only abnormal results are displayed) Labs Reviewed - No data to display  EKG  EKG Interpretation None       Radiology No results found.  Procedures .Marland KitchenIncision and Drainage Date/Time: 05/04/2017 1:20 PM Performed by: Jaynie Crumble Authorized by: Jaynie Crumble   Consent:     Consent obtained:  Verbal   Consent given by:  Patient   Risks discussed:  Bleeding, pain and infection   Alternatives discussed:  Alternative treatment Location:    Type:  Abscess   Location:  Mouth   Mouth location:  Alveolar process Anesthesia (see MAR for exact dosages):    Anesthesia method:  Local infiltration   Local anesthetic:  Lidocaine 2% w/o epi Procedure type:    Complexity:  Simple Procedure details:    Incision types:  Single straight   Scalpel blade:  11   Wound management:  Probed and deloculated and irrigated with saline   Drainage:  Purulent and bloody   Drainage amount:  Moderate   Wound treatment:  Wound left open   Packing materials:  None Post-procedure details:    Patient tolerance of procedure:  Tolerated well, no immediate complications .Nerve Block Date/Time: 05/04/2017 12:52 PM Performed by: Jaynie Crumble Authorized by: Jaynie Crumble   Consent:    Consent obtained:  Verbal   Consent given by:  Patient   Risks discussed:  Swelling, unsuccessful block, pain and bleeding   Alternatives discussed:  Alternative treatment Indications:    Indications:  Procedural anesthesia Location:    Body area:  Head   Head nerve blocked: inferior alveolar. Skin anesthesia (see MAR for exact dosages):    Skin anesthesia method:  None Procedure details (see MAR for exact dosages):    Block needle gauge:  27 G   Anesthetic injected:  Bupivacaine 0.5% WITH epi   Steroid injected:  None   Additive injected:  None   Injection procedure:  Anatomic landmarks identified and anatomic landmarks palpated Post-procedure details:    Dressing:  None   Outcome:  Anesthesia achieved   Patient tolerance of procedure:  Tolerated well, no immediate complications   (including critical care time)  Medications Ordered in ED Medications - No data to display   Initial Impression / Assessment and Plan / ED Course  I have reviewed the triage vital signs and the  nursing notes.  Pertinent labs & imaging results that were available during my care of the patient were reviewed by me and considered in my medical decision making (see chart for details).   patient emergency department with right lower facial swelling and dental abscess. After performing a dental block, incision and drainage of the abscess performed with purulent drainage. We'll start an antibiotic. Follow-up with oral surgery. No evidence of Ludwig's angina at this time. Patient nontoxic appearing. Afebrile.   Vitals:   05/04/17 1052 05/04/17 1328  BP: (!) 143/96 (!) 156/101  Pulse: 81 64  Resp: 17 18  Temp: 98.5 F (36.9 C)   SpO2: 100% 100%    Final Clinical Impressions(s) / ED Diagnoses   Final diagnoses:  Dental abscess    New Prescriptions Discharge Medication List as of 05/04/2017  1:34 PM    START taking these medications   Details  penicillin v potassium (VEETID) 500 MG tablet Take 1 tablet (500 mg total) by mouth 4 (  four) times daily., Starting Thu 05/04/2017, Until Thu 05/11/2017, Print       I personally performed the services described in this documentation, which was scribed in my presence. The recorded information has been reviewed and is accurate.     Jaynie Crumble, PA-C 05/04/17 2100    Shaune Pollack, MD 05/05/17 1620

## 2017-05-04 NOTE — Discharge Instructions (Signed)
Start taking penicillin as prescribed until all gone. Take ibuprofen for pain. Norco for severe pain only. Warm salt water rinses every hour. Warm compresses. Follow up with oral surgery

## 2018-02-22 ENCOUNTER — Emergency Department (HOSPITAL_COMMUNITY)
Admission: EM | Admit: 2018-02-22 | Discharge: 2018-02-22 | Disposition: A | Discharge status: Home / Self Care | Payer: Self-pay | Attending: Emergency Medicine | Admitting: Emergency Medicine

## 2018-02-22 ENCOUNTER — Other Ambulatory Visit: Payer: Self-pay

## 2018-02-22 ENCOUNTER — Encounter (HOSPITAL_COMMUNITY): Payer: Self-pay | Admitting: Emergency Medicine

## 2018-02-22 DIAGNOSIS — Z79899 Other long term (current) drug therapy: Secondary | ICD-10-CM | POA: Insufficient documentation

## 2018-02-22 DIAGNOSIS — K0889 Other specified disorders of teeth and supporting structures: Secondary | ICD-10-CM | POA: Insufficient documentation

## 2018-02-22 MED ORDER — LIDOCAINE VISCOUS 2 % MT SOLN: 15.0000 mL | OROMUCOSAL | 0 refills | Status: DC | PRN

## 2018-02-22 MED ORDER — PENICILLIN V POTASSIUM 500 MG PO TABS: 500.0000 mg | ORAL_TABLET | Freq: Four times a day (QID) | ORAL | 0 refills | Status: AC

## 2018-02-22 MED ORDER — NAPROXEN 375 MG PO TABS: 375.0000 mg | ORAL_TABLET | Freq: Two times a day (BID) | ORAL | 0 refills | Status: DC

## 2018-02-22 MED ORDER — TRAMADOL HCL 50 MG PO TABS
50.0000 mg | ORAL_TABLET | Freq: Once | ORAL | Status: AC
  Administered 2018-02-22: 50 mg via ORAL
  Filled 2018-02-22: qty 1

## 2018-02-22 NOTE — Discharge Instructions (Signed)
Please read and follow all provided instructions.  Your diagnoses today include:  1. Pain, dental     The exam and treatment you received today has been provided on an emergency basis only. This is not a substitute for complete medical or dental care. This problem will not resolve on its own without the care of a dentist.  Tests performed today include: Vital signs. See below for your results today.   Medications prescribed:   Take any prescribed medications only as directed. Use ibuprofen or naproxen for pain. Use the viscous lidocaine for mouth pain. Swish with the lidocaine and spit it out. Do not swallow it.  Please take all of your antibiotics until finished!   You may develop abdominal discomfort or diarrhea from the antibiotic.  You may help offset this with probiotics which you can buy or get in yogurt. Do not eat or take the probiotics until 2 hours after your antibiotic. Do not take your medicine if develop an itchy rash, swelling in your mouth or lips, or difficulty breathing.   Home care instructions:  Follow any educational materials contained in this packet.  Follow-up instructions: Please follow-up with your dentist for further evaluation of your symptoms.   Dental Assistance: See handout for dental referrals  Return instructions:  Please return to the Emergency Department if you experience worsening symptoms. Please return if you develop a fever, you develop more swelling in your face or neck, you have trouble breathing or swallowing food. Please return if you have any other emergent concerns.  Additional Information:  Your vital signs today were: BP (!) 166/99 (BP Location: Right Arm)    Pulse 72    Temp 99 F (37.2 C) (Oral)    Resp 16    SpO2 100%  If your blood pressure (BP) was elevated above 135/85 this visit, please have this repeated by your doctor within one month. --------------

## 2018-02-22 NOTE — ED Provider Notes (Signed)
[facility] Note   CSN: [id number] Arrival date & time: 02/22/18  1445     History   Chief Complaint Chief Complaint  Patient presents with  . Dental Pain    HPI George DolinSean Wells is a 32 y.o. male who presents the emergency department today for left upper dental pain.  Patient states that several days ago he was eating and noticed pain of his left upper tooth.  He notes that 2 days ago he yawned and felt like this tooth cracked.  Since that time he has been having pain in this area without radiation.  He notes he has taken over-the-counter ibuprofen, Tylenol, Goody powders for his symptoms without relief.  He states that he does not have a dentist.  He is asking for a referral. Denies fever, chills, voice change, inability to control secretions, nausea/vomiting, facial swelling, dysphagia, odynophagia, drainage or trauma  HPI  History reviewed. No pertinent past medical history.  There are no active problems to display for this patient.   History reviewed. No pertinent surgical history.      Home Medications    Prior to Admission medications   Medication Sig Start Date End Date Taking? Authorizing Provider  HYDROcodone-acetaminophen (NORCO) 5-325 MG tablet Take 1 tablet by mouth every 6 (six) hours as needed for moderate pain. 05/04/17   Kirichenko, Tatyana, PA-C  ibuprofen (ADVIL,MOTRIN) 600 MG tablet Take 1 tablet (600 mg total) by mouth every 6 (six) hours as needed. 05/04/17   Kirichenko, Tatyana, PA-C  naproxen (NAPROSYN) 375 MG tablet Take 1 tablet (375 mg total) by mouth 2 (two) times daily. 08/31/15   Deatra Canterxford, William J, FNP  traMADol (ULTRAM) 50 MG tablet Take 1 tablet (50 mg total) by mouth every 6 (six) hours as needed for pain. 03/16/13   Hayden RasmussenMabe, David, NP    Family History No family history on file.  Social History Social History   Tobacco Use  . Smoking status: Never Smoker  Substance Use Topics  . Alcohol use: No  .  Drug use: No     Allergies   Patient has no known allergies.   Review of Systems Review of Systems  All other systems reviewed and are negative.    Physical Exam Updated Vital Signs BP (!) 166/99 (BP Location: Right Arm)   Pulse 72   Temp 99 F (37.2 C) (Oral)   Resp 16   SpO2 100%   Physical Exam  Constitutional: He appears well-developed and well-nourished.  HENT:  Head: Normocephalic and atraumatic.  Right Ear: External ear normal.  Left Ear: External ear normal.  Mouth/Throat:    The patient has normal phonation and is in control of secretions. No stridor.  Midline uvula without edema. Soft palate rises symmetrically.  No tonsillar erythema or exudates. No PTA. Tongue protrusion is normal. No trismus. No creptius on neck palpation.  No facial swelling or neck swelling.  No tenderness palpation of the temporal artery.  The patient has very poor dentition.  There is multiple cavities noted.  Tenderness to percussion as indicated in diagram.  No gingival erythema or fluctuance noted.  Multiple tori noted without floor edema present.  Mucus membranes moist.  No TMJ crepitus or pain on palpation.  Eyes: Conjunctivae are normal. Right eye exhibits no discharge. Left eye exhibits no discharge. No scleral icterus.  Neck:  No nuchal rigidity or meningismus  Pulmonary/Chest: Effort normal. No respiratory distress.  Neurological: He is alert.  Skin: Skin is  warm and dry. Capillary refill takes less than 2 seconds. No pallor.  No vesicular-like rash in the face.  Psychiatric: He has a normal mood and affect.  Nursing note and vitals reviewed.    ED Treatments / Results  Labs (all labs ordered are listed, but only abnormal results are displayed) Labs Reviewed - No data to display  EKG None  Radiology No results found.  Procedures Procedures (including critical care time)  Medications Ordered in ED Medications  traMADol (ULTRAM) tablet 50 mg (50 mg Oral Given  02/22/18 1631)     Initial Impression / Assessment and Plan / ED Course  I have reviewed the triage vital signs and the nursing notes.  Pertinent labs & imaging results that were available during my care of the patient were reviewed by me and considered in my medical decision making (see chart for details).     Patient with toothache.  No gross abscess.  Exam unconcerning for Ludwig's angina or spread of infection.  Will treat with penicillin and anti-inflammatories medicine.  Urged patient to follow-up with dentist.  Return precautions discussed. Appears safe for discharge.   Final Clinical Impressions(s) / ED Diagnoses   Final diagnoses:  Pain, dental    ED Discharge Orders        Ordered    naproxen (NAPROSYN) 375 MG tablet  2 times daily     02/22/18 1647    lidocaine (XYLOCAINE) 2 % solution  As needed     02/22/18 1647    penicillin v potassium (VEETID) 500 MG tablet  4 times daily     02/22/18 1647       Princella Pellegrini 02/22/18 1650    Tegeler, Canary Brim, MD 02/22/18 [phone]

## 2018-02-22 NOTE — ED Triage Notes (Signed)
Pt states for the last 3 days he has had pain in his right upper mouth, pt states pain became worse after yawning.

## 2018-07-09 ENCOUNTER — Ambulatory Visit (INDEPENDENT_AMBULATORY_CARE_PROVIDER_SITE_OTHER): Payer: Self-pay

## 2018-07-09 ENCOUNTER — Ambulatory Visit (HOSPITAL_COMMUNITY)
Admission: EM | Admit: 2018-07-09 | Discharge: 2018-07-09 | Disposition: A | Discharge status: Home / Self Care | Payer: Self-pay | Attending: Physician Assistant | Admitting: Physician Assistant

## 2018-07-09 ENCOUNTER — Encounter (HOSPITAL_COMMUNITY): Payer: Self-pay | Admitting: Emergency Medicine

## 2018-07-09 DIAGNOSIS — M791 Myalgia, unspecified site: Secondary | ICD-10-CM

## 2018-07-09 MED ORDER — IBUPROFEN 600 MG PO TABS: 600.0000 mg | ORAL_TABLET | Freq: Four times a day (QID) | ORAL | 0 refills | Status: AC | PRN

## 2018-07-09 MED ORDER — METHOCARBAMOL 500 MG PO TABS: 500.0000 mg | ORAL_TABLET | Freq: Two times a day (BID) | ORAL | 0 refills | Status: AC

## 2018-07-09 NOTE — ED Provider Notes (Signed)
[facility]    CSN: [id number] Arrival date & time: 07/09/18  1020     History   Chief Complaint Chief Complaint  Patient presents with  . Generalized Body Aches    HPI Linden DolinSean Duffus is a 32 y.o. male.   The history is provided by the patient. No language interpreter was used.  Chest Pain  Pain location:  L chest Pain quality: aching   Pain radiates to:  L arm Pain severity:  Moderate Onset quality:  Gradual Timing:  Constant Progression:  Worsening Chronicity:  New Relieved by:  Nothing Worsened by:  Nothing Ineffective treatments:  None tried Associated symptoms: no fever and no shortness of breath   Risk factors: no hypertension   Pt reports he began having pain 2 days ago in his left chest and left shoulder.  Pt reports he slept late/extra on his day off. Pt rports pain began after.  No injury, no fever, no chills, no DVT risk  History reviewed. No pertinent past medical history.  There are no active problems to display for this patient.   History reviewed. No pertinent surgical history.     Home Medications    Prior to Admission medications   Medication Sig Start Date End Date Taking? Authorizing Provider  HYDROcodone-acetaminophen (NORCO) 5-325 MG tablet Take 1 tablet by mouth every 6 (six) hours as needed for moderate pain. Patient not taking: Reported on 07/09/2018 05/04/17   Jaynie CrumbleKirichenko, Tatyana, PA-C  ibuprofen (ADVIL,MOTRIN) 600 MG tablet Take 1 tablet (600 mg total) by mouth every 6 (six) hours as needed. 05/04/17   Kirichenko, Tatyana, PA-C  lidocaine (XYLOCAINE) 2 % solution Use as directed 15 mLs in the mouth or throat as needed for mouth pain. Patient not taking: Reported on 07/09/2018 02/22/18   Jacinto HalimMaczis, Michael M, PA-C  naproxen (NAPROSYN) 375 MG tablet Take 1 tablet (375 mg total) by mouth 2 (two) times daily. 02/22/18   Maczis, Elmer SowMichael M, PA-C  traMADol (ULTRAM) 50 MG tablet Take 1 tablet (50 mg total) by mouth every 6 (six) hours as needed  for pain. Patient not taking: Reported on 07/09/2018 03/16/13   Hayden RasmussenMabe, David, NP    Family History History reviewed. No pertinent family history.  Social History Social History   Tobacco Use  . Smoking status: Never Smoker  Substance Use Topics  . Alcohol use: No  . Drug use: No     Allergies   Patient has no known allergies.   Review of Systems Review of Systems  Constitutional: Negative for fever.  Respiratory: Negative for shortness of breath.   Cardiovascular: Positive for chest pain.  All other systems reviewed and are negative.    Physical Exam Triage Vital Signs ED Triage Vitals [07/09/18 1053]  Enc Vitals Group     BP (!) 155/71     Pulse Rate 77     Resp 18     Temp 98 F (36.7 C)     Temp Source Oral     SpO2 100 %     Weight      Height      Head Circumference      Peak Flow      Pain Score      Pain Loc      Pain Edu?      Excl. in GC?    No data found.  Updated Vital Signs BP (!) 155/71 (BP Location: Left Arm)   Pulse 77   Temp 98 F (36.7 C) (  Oral)   Resp 18   SpO2 100%   Visual Acuity Right Eye Distance:   Left Eye Distance:   Bilateral Distance:    Right Eye Near:   Left Eye Near:    Bilateral Near:     Physical Exam  Constitutional: He appears well-developed and well-nourished.  HENT:  Head: Normocephalic and atraumatic.  Eyes: Conjunctivae are normal.  Neck: Neck supple.  Cardiovascular: Normal rate and regular rhythm.  No murmur heard. Pulmonary/Chest: Effort normal and breath sounds normal. No respiratory distress.  Abdominal: Soft. There is no tenderness.  Musculoskeletal: He exhibits no edema.  Neurological: He is alert.  Skin: Skin is warm and dry.  Psychiatric: He has a normal mood and affect.  Nursing note and vitals reviewed.    UC Treatments / Results  Labs (all labs ordered are listed, but only abnormal results are displayed) Labs Reviewed - No data to display  EKG None  Radiology Dg Chest 2  View  Result Date: 07/09/2018 CLINICAL DATA:  Left body pain for 2 days. EXAM: CHEST - 2 VIEW COMPARISON:  March 07, 2012 FINDINGS: The heart size and mediastinal contours are within normal limits. Both lungs are clear. The visualized skeletal structures are unremarkable. IMPRESSION: No active cardiopulmonary disease. Electronically Signed   By: Sherian ReinWei-Chen  Lin M.D.   On: 07/09/2018 12:03    Procedures Procedures (including critical care time)  Medications Ordered in UC Medications - No data to display  Initial Impression / Assessment and Plan / UC Course  I have reviewed the triage vital signs and the nursing notes.  Pertinent labs & imaging results that were available during my care of the patient were reviewed by me and considered in my medical decision making (see chart for details).     Chest xray normal,  Possible muscular pain.   I will try pt on robxin and ibuprofen.  He is advised to recheck here in 2 days if not improving. Final Clinical Impressions(s) / UC Diagnoses   Final diagnoses:  Myalgia     Discharge Instructions     Return for recheck if symptoms persist past one week      ED Prescriptions    Medication Sig Dispense Auth. Provider   methocarbamol (ROBAXIN) 500 MG tablet Take 1 tablet (500 mg total) by mouth 2 (two) times daily. 20 tablet Vicke Plotner K, [place]PA-C   ibuprofen (ADVIL,MOTRIN) 600 MG tablet Take 1 tablet (600 mg total) by mouth every 6 (six) hours as needed. 30 tablet Elson AreasSofia, Gwendlyon Zumbro K, [place]PA-C     Controlled Substance Prescriptions [place] Controlled Substance Registry consulted? Not Applicable   Elson AreasSofia, Ayomikun Starling K, [place]-C 07/09/18 1217

## 2018-07-09 NOTE — Discharge Instructions (Addendum)
Return for recheck if symptoms persist past one week.  

## 2018-07-09 NOTE — ED Triage Notes (Signed)
Pt sts left sided pain from head to toe x 2 days

## 2019-10-02 ENCOUNTER — Emergency Department (HOSPITAL_COMMUNITY): Payer: Self-pay

## 2019-10-02 ENCOUNTER — Other Ambulatory Visit: Payer: Self-pay

## 2019-10-02 ENCOUNTER — Emergency Department (HOSPITAL_COMMUNITY)
Admission: EM | Admit: 2019-10-02 | Discharge: 2019-10-02 | Disposition: A | Discharge status: Home / Self Care | Payer: Self-pay | Attending: Emergency Medicine | Admitting: Emergency Medicine

## 2019-10-02 ENCOUNTER — Encounter (HOSPITAL_COMMUNITY): Payer: Self-pay | Admitting: Emergency Medicine

## 2019-10-02 DIAGNOSIS — R55 Syncope and collapse: Secondary | ICD-10-CM | POA: Insufficient documentation

## 2019-10-02 LAB — CBC WITH DIFFERENTIAL/PLATELET
Abs Immature Granulocytes: 0.02 10*3/uL (ref 0.00–0.07)
Basophils Absolute: 0 10*3/uL (ref 0.0–0.1)
Basophils Relative: 0 %
Eosinophils Absolute: 0 10*3/uL (ref 0.0–0.5)
Eosinophils Relative: 1 %
HCT: 45.5 % (ref 39.0–52.0)
Hemoglobin: 13.8 g/dL (ref 13.0–17.0)
Immature Granulocytes: 0 %
Lymphocytes Relative: 16 %
Lymphs Abs: 1 10*3/uL (ref 0.7–4.0)
MCH: 25.8 pg — ABNORMAL LOW (ref 26.0–34.0)
MCHC: 30.3 g/dL (ref 30.0–36.0)
MCV: 85.2 fL (ref 80.0–100.0)
Monocytes Absolute: 0.6 10*3/uL (ref 0.1–1.0)
Monocytes Relative: 9 %
Neutro Abs: 4.7 10*3/uL (ref 1.7–7.7)
Neutrophils Relative %: 74 %
Platelets: 272 10*3/uL (ref 150–400)
RBC: 5.34 MIL/uL (ref 4.22–5.81)
RDW: 13.2 % (ref 11.5–15.5)
WBC: 6.4 10*3/uL (ref 4.0–10.5)
nRBC: 0 % (ref 0.0–0.2)

## 2019-10-02 LAB — BASIC METABOLIC PANEL
Anion gap: 10 (ref 5–15)
BUN: 10 mg/dL (ref 6–20)
CO2: 25 mmol/L (ref 22–32)
Calcium: 9.3 mg/dL (ref 8.9–10.3)
Chloride: 103 mmol/L (ref 98–111)
Creatinine, Ser: 1.06 mg/dL (ref 0.61–1.24)
GFR calc Af Amer: >60 mL/min (ref >60)
GFR calc non Af Amer: >60 mL/min (ref >60)
Glucose, Bld: 92 mg/dL (ref 70–99)
Potassium: 4 mmol/L (ref 3.5–5.1)
Sodium: 138 mmol/L (ref 135–145)

## 2019-10-02 NOTE — Discharge Instructions (Signed)
Return to the ER for new or worsening symptoms including fever, worsening chest pain, shortness of breath, any loss of consciousness.

## 2019-10-02 NOTE — ED Provider Notes (Signed)
[facility] EMERGENCY DEPARTMENT Provider Note   CSN: [id number] Arrival date & time: 10/02/19  0316     History   Chief Complaint Chief Complaint  Patient presents with  . Chest Pain    HPI George Wells is a 33 y.o. male.     Patient presents to the emergency department with a chief complaint of syncopal episode.  He states that he was at work, standing at his desk, when he suddenly blacked out.  He denies feeling dizzy or lightheaded prior to blacking out.  States that he woke up on the ground with a coworker in front of him.  States that he felt a little confused and had some pain in his chest upon waking up.  States that he still feels a small amount of pain in his chest, which is reproducible with movement and palpation.  He has not taken anything for symptoms.  Denies any history of early heart disease in his family or sudden cardiac death.  Denies any history of PE or DVT.  Denies any recent surgeries, immobilization, or long travel.  He denies any recent illnesses including no fever, cough, sore throat, body aches.  The history is provided by the patient. No language interpreter was used.    History reviewed. No pertinent past medical history.  There are no active problems to display for this patient.   No past surgical history on file.      Home Medications    Prior to Admission medications   Medication Sig Start Date End Date Taking? Authorizing Provider  ibuprofen (ADVIL,MOTRIN) 600 MG tablet Take 1 tablet (600 mg total) by mouth every 6 (six) hours as needed. 07/09/18   Elson Areas, PA-C  methocarbamol (ROBAXIN) 500 MG tablet Take 1 tablet (500 mg total) by mouth 2 (two) times daily. 07/09/18   Elson Areas, PA-C    Family History No family history on file.  Social History Social History   Tobacco Use  . Smoking status: Never Smoker  Substance Use Topics  . Alcohol use: No  . Drug use: No     Allergies   Patient has no known  allergies.   Review of Systems Review of Systems  All other systems reviewed and are negative.    Physical Exam Updated Vital Signs BP (!) 144/84   Pulse 71   Temp 98.1 F (36.7 C)   Resp 18   SpO2 97%   Physical Exam Vitals signs and nursing note reviewed.  Constitutional:      Appearance: He is well-developed.  HENT:     Head: Normocephalic and atraumatic.  Eyes:     Conjunctiva/sclera: Conjunctivae normal.  Neck:     Musculoskeletal: Neck supple.  Cardiovascular:     Rate and Rhythm: Normal rate and regular rhythm.     Heart sounds: No murmur.  Pulmonary:     Effort: Pulmonary effort is normal. No respiratory distress.     Breath sounds: Normal breath sounds.  Abdominal:     Palpations: Abdomen is soft.     Tenderness: There is no abdominal tenderness.  Musculoskeletal: Normal range of motion.  Skin:    General: Skin is warm and dry.  Neurological:     Mental Status: He is alert and oriented to person, place, and time.  Psychiatric:        Mood and Affect: Mood normal.        Behavior: Behavior normal.      ED Treatments /  Results  Labs (all labs ordered are listed, but only abnormal results are displayed) Labs Reviewed  CBC WITH DIFFERENTIAL/PLATELET - Abnormal; Notable for the following components:      Result Value   MCH 25.8 (*)    All other components within normal limits  BASIC METABOLIC PANEL  CBG MONITORING, ED    EKG EKG Interpretation  Date/Time:  Wednesday October 02 2019 03:29:16 EST Ventricular Rate:  65 PR Interval:  182 QRS Duration: 104 QT Interval:  390 QTC Calculation: 405 R Axis:   81 Text Interpretation: Normal sinus rhythm Incomplete right bundle branch block Borderline ECG rbbb not new likely BER Confirmed by Merrily Pew [phone]) on 10/02/2019 3:32:55 AM   Radiology Dg Chest 2 View  Result Date: 10/02/2019 CLINICAL DATA:  Arm tingling and numbness. EXAM: CHEST - 2 VIEW COMPARISON:  07/09/2018 FINDINGS: The heart  size and mediastinal contours are within normal limits. Both lungs are clear. The visualized skeletal structures are unremarkable. IMPRESSION: No active cardiopulmonary disease. Electronically Signed   By: Constance Holster M.D.   On: 10/02/2019 03:53    Procedures Procedures (including critical care time)  Medications Ordered in ED Medications - No data to display   Initial Impression / Assessment and Plan / ED Course  I have reviewed the triage vital signs and the nursing notes.  Pertinent labs & imaging results that were available during my care of the patient were reviewed by me and considered in my medical decision making (see chart for details).        Patient here with syncopal episode while at work.  He did not injure himself.  He complained of some chest pain afterward.  States that he feels fine now.  EKG shows no ischemic changes or arrhythmias.  Laboratory work-up is normal.  Low risk for ACS based on age and risk factors.  Doubt PE, he is not hypoxic nor tachycardic.  He does not have any other PE risk factors.  He is not orthostatic.  Question vasovagul syncope from standing at his desk.  Outpatient follow-up.  Return precautions discussed.  Final Clinical Impressions(s) / ED Diagnoses   Final diagnoses:  Syncope, unspecified syncope type    ED Discharge Orders    None       Montine Circle, PA-C 10/02/19 0445    Mesner, Corene Cornea, MD 10/02/19 [phone]

## 2019-10-02 NOTE — ED Triage Notes (Addendum)
Per EMS- pt was at work. Pt has period of "LOC" where he was staring at a computer and then "came to" when someone said his name. Pt reported both arms were numb and tingling afterwards. Pt reports new onset chest pain with EMS with shortness of breath. Pain improved with repositioning. Denies fever chills. No shortness of breath/acute distress.

## 2020-09-24 ENCOUNTER — Emergency Department (HOSPITAL_COMMUNITY)
Admission: EM | Admit: 2020-09-24 | Discharge: 2020-09-24 | Disposition: A | Discharge status: Home / Self Care | Payer: Self-pay | Attending: Emergency Medicine | Admitting: Emergency Medicine

## 2020-09-24 ENCOUNTER — Encounter (HOSPITAL_COMMUNITY): Payer: Self-pay

## 2020-09-24 ENCOUNTER — Emergency Department (HOSPITAL_COMMUNITY): Payer: Self-pay

## 2020-09-24 ENCOUNTER — Other Ambulatory Visit: Payer: Self-pay

## 2020-09-24 DIAGNOSIS — R531 Weakness: Secondary | ICD-10-CM | POA: Insufficient documentation

## 2020-09-24 DIAGNOSIS — R202 Paresthesia of skin: Secondary | ICD-10-CM | POA: Insufficient documentation

## 2020-09-24 DIAGNOSIS — R569 Unspecified convulsions: Secondary | ICD-10-CM | POA: Insufficient documentation

## 2020-09-24 DIAGNOSIS — Z79899 Other long term (current) drug therapy: Secondary | ICD-10-CM | POA: Insufficient documentation

## 2020-09-24 DIAGNOSIS — R55 Syncope and collapse: Secondary | ICD-10-CM | POA: Insufficient documentation

## 2020-09-24 HISTORY — DX: Unspecified convulsions: R56.9

## 2020-09-24 LAB — BASIC METABOLIC PANEL
Anion gap: 10 (ref 5–15)
BUN: 9 mg/dL (ref 6–20)
CO2: 24 mmol/L (ref 22–32)
Calcium: 9.4 mg/dL (ref 8.9–10.3)
Chloride: 105 mmol/L (ref 98–111)
Creatinine, Ser: 0.97 mg/dL (ref 0.61–1.24)
GFR, Estimated: >60 mL/min (ref >60)
Glucose, Bld: 86 mg/dL (ref 70–99)
Potassium: 3.7 mmol/L (ref 3.5–5.1)
Sodium: 139 mmol/L (ref 135–145)

## 2020-09-24 LAB — CBC WITH DIFFERENTIAL/PLATELET
Abs Immature Granulocytes: 0 10*3/uL (ref 0.00–0.07)
Basophils Absolute: 0 10*3/uL (ref 0.0–0.1)
Basophils Relative: 0 %
Eosinophils Absolute: 0 10*3/uL (ref 0.0–0.5)
Eosinophils Relative: 1 %
HCT: 41.9 % (ref 39.0–52.0)
Hemoglobin: 13.4 g/dL (ref 13.0–17.0)
Immature Granulocytes: 0 %
Lymphocytes Relative: 24 %
Lymphs Abs: 1 10*3/uL (ref 0.7–4.0)
MCH: 26.3 pg (ref 26.0–34.0)
MCHC: 32 g/dL (ref 30.0–36.0)
MCV: 82.2 fL (ref 80.0–100.0)
Monocytes Absolute: 0.4 10*3/uL (ref 0.1–1.0)
Monocytes Relative: 9 %
Neutro Abs: 2.8 10*3/uL (ref 1.7–7.7)
Neutrophils Relative %: 66 %
Platelets: 263 10*3/uL (ref 150–400)
RBC: 5.1 MIL/uL (ref 4.22–5.81)
RDW: 13.3 % (ref 11.5–15.5)
WBC: 4.3 10*3/uL (ref 4.0–10.5)
nRBC: 0 % (ref 0.0–0.2)

## 2020-09-24 LAB — CBG MONITORING, ED: Glucose-Capillary: 85 mg/dL (ref 70–99)

## 2020-09-24 NOTE — ED Notes (Signed)
Extra pillow provided to patient per request

## 2020-09-24 NOTE — ED Notes (Signed)
seizure pads placed on bed rails

## 2020-09-24 NOTE — ED Provider Notes (Signed)
[facility]-EMERGENCY DEPT Provider Note   CSN: [id number] Arrival date & time: 09/24/20  1612     History Chief Complaint  Patient presents with  . Loss of Consciousness  . possible seizure    George Wells is a 34 y.o. male.  Patient is a 34 year old male with no known past medical history.  He presents today for evaluation of an unresponsive episode.  Patient states he was eating a slice of pie, then the next thing he knew he was unresponsive on his bed.  He woke to his dog and cat scratching at him.  He denies any bleeding from the mouth, but feels as though he may have been "chewing on the inside of his cheek".  He denies any loss of bowel or bladder continence.  His neighbor across the hall apparently heard his dog barking and went to check on him.  He was taken to urgent care, then brought here for further work-up.  Patient reports having had a seizure as a child and also describes an episode 4 to 5 months ago where he "passed out".  He denies any headache, visual disturbances, numbness.  He does feel generally weak.  The history is provided by the patient.  Loss of Consciousness Episode history:  Single Most recent episode:  Today Progression:  Resolved Chronicity:  New Relieved by:  Nothing Worsened by:  Nothing Ineffective treatments:  None tried      Past Medical History:  Diagnosis Date  . Seizures (HCC)     There are no problems to display for this patient.   History reviewed. No pertinent surgical history.     Family History  Problem Relation Age of Onset  . Diabetes Father     Social History   Tobacco Use  . Smoking status: Never Smoker  . Smokeless tobacco: Never Used  Vaping Use  . Vaping Use: Never used  Substance Use Topics  . Alcohol use: No  . Drug use: No    Home Medications Prior to Admission medications   Medication Sig Start Date End Date Taking? Authorizing Provider  ibuprofen (ADVIL,MOTRIN) 600 MG  tablet Take 1 tablet (600 mg total) by mouth every 6 (six) hours as needed. Patient not taking: Reported on 10/02/2019 07/09/18   Elson Areas, PA-C  methocarbamol (ROBAXIN) 500 MG tablet Take 1 tablet (500 mg total) by mouth 2 (two) times daily. Patient not taking: Reported on 10/02/2019 07/09/18   Elson Areas, PA-C    Allergies    Patient has no known allergies.  Review of Systems   Review of Systems  Cardiovascular: Positive for syncope.  All other systems reviewed and are negative.   Physical Exam Updated Vital Signs BP (!) 148/91 (BP Location: Left Arm)   Pulse 62   Temp 99 F (37.2 C) (Oral)   Resp 15   Ht 6\' 2"  (1.88 m)   Wt 80.7 kg   SpO2 100%   BMI 22.85 kg/m   Physical Exam Vitals and nursing note reviewed.  Constitutional:      General: He is not in acute distress.    Appearance: He is well-developed. He is not diaphoretic.  HENT:     Head: Normocephalic and atraumatic.     Mouth/Throat:     Mouth: Mucous membranes are moist.     Comments: There is no evidence for oral trauma.  There are no bite marks to the tongue or cheek. Eyes:     Extraocular Movements: Extraocular  movements intact.     Pupils: Pupils are equal, round, and reactive to light.  Cardiovascular:     Rate and Rhythm: Normal rate and regular rhythm.     Heart sounds: No murmur heard.  No friction rub.  Pulmonary:     Effort: Pulmonary effort is normal. No respiratory distress.     Breath sounds: Normal breath sounds. No wheezing or rales.  Abdominal:     General: Bowel sounds are normal. There is no distension.     Palpations: Abdomen is soft.     Tenderness: There is no abdominal tenderness.  Musculoskeletal:        General: Normal range of motion.     Cervical back: Normal range of motion and neck supple.  Skin:    General: Skin is warm and dry.  Neurological:     General: No focal deficit present.     Mental Status: He is alert and oriented to person, place, and time.      Cranial Nerves: No cranial nerve deficit.     Sensory: No sensory deficit.     Motor: No weakness.     Coordination: Coordination normal.     ED Results / Procedures / Treatments   Labs (all labs ordered are listed, but only abnormal results are displayed) Labs Reviewed  BASIC METABOLIC PANEL  CBC WITH DIFFERENTIAL/PLATELET  CBG MONITORING, ED    EKG ED ECG REPORT   Date: 09/24/2020  Rate: 60  Rhythm: normal sinus rhythm  QRS Axis: normal  Intervals: normal  ST/T Wave abnormalities: normal  Conduction Disutrbances:none  Narrative Interpretation:   Old EKG Reviewed: none available  I have personally reviewed the EKG tracing and agree with the computerized printout as noted.        Radiology No results found.  Procedures Procedures (including critical care time)  Medications Ordered in ED Medications - No data to display  ED Course  I have reviewed the triage vital signs and the nursing notes.  Pertinent labs & imaging results that were available during my care of the patient were reviewed by me and considered in my medical decision making (see chart for details).    MDM Rules/Calculators/A&P  Patient is a 34 year old male presenting here for evaluation of possible seizure activity.  He tells me he was eating a slice of pie and the next thing he knew woke up in his bed.  He states that he was chewing on the inside of his mouth when he woke up.  I am unable to see any oral trauma or intraoral lacerations.  Patient's neurologic exam is nonfocal and vital signs are stable.  Orthostatic vital signs also obtained as patient described feeling dizzy when he stands.  These were also unchanging.  Work-up reveals a normal EKG, normal laboratory studies, negative head CT.  I am uncertain as to the etiology of the patient's symptoms, however I feel referral to neurology to consider EEG and further evaluation is indicated.  This referral will be made.  While patient in  the ER, he he complains of multiple other issues regarding his respiratory rate, numbness in his hands, back spasms.  His sister also expresses multiple concerns, however nothing today appears emergent or in need of hospitalization.  Again, I will make referral to neurology for further evaluation of the symptoms.  To return as needed if he worsens in the meantime.  Patient given a work excuse for the next 3 days.  Final Clinical Impression(s) / ED Diagnoses  Final diagnoses:  None    Rx / DC Orders ED Discharge Orders    None       Geoffery Lyons, MD 09/24/20 [phone]

## 2020-09-24 NOTE — ED Triage Notes (Signed)
Patient states he was eating pumpkin pie and then he woke up lying on the bed. Patient states he woke up with drool on his mouth and feels like his cheeks had been bit. Patient states he had a seizure when he was small and thinks he had one at his job 4-5 months ago.

## 2020-09-24 NOTE — Discharge Instructions (Signed)
You should not drive, swim, climb ladders, or perform other activities that an additional episode may put you in danger until cleared by neurology.  Drink plenty of fluids and get plenty of rest.  Follow-up with neurology in the next week.  A referral has been placed to [facility] neurology.  They should contact you to make these arrangements.  If you have not heard from them by Monday, call the number above to make these arrangements.  Return to the emergency department in the meantime if symptoms significantly worsen or change.

## 2020-10-07 ENCOUNTER — Telehealth: Payer: Self-pay | Admitting: General Practice

## 2020-10-07 NOTE — Progress Notes (Signed)
Patient ID: George Wells, male   DOB: [birth year], 34 y.o.   MRN: [id number]     George Wells, is a 34 y.o. male  JIR:[id number]  [id number]:[id number]  DOB - 08-Jan-1986  Subjective:  Chief Complaint and HPI: George Wells is a 34 y.o. male here today to establish care and for a follow up visit After ED visit 09/24/2020 for seizure-like activity.  He says he has had episodes like this since as early as age 3 without ever having a formal work-up.  He denies any drug or alcohol use.  C/o muscle spasms and tingling in BUE.  He is L hand dominant.  No FH seizure d/o.  He is also c/o extreme fatigue since this episode.  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  NK head injury.  He has episodes frequently where he loses 4-5 mins of time.  No further severe episodes since ED visit.  No CP.  No FH early cardiac problems.  No SOB.  Appetite is poor    From ED A/P: Patient is a 34 year old male presenting here for evaluation of possible seizure activity.  He tells me he was eating a slice of pie and the next thing he knew woke up in his bed.  He states that he was chewing on the inside of his mouth when he woke up.  I am unable to see any oral trauma or intraoral lacerations.  Patient's neurologic exam is nonfocal and vital signs are stable.  Orthostatic vital signs also obtained as patient described feeling dizzy when he stands.  These were also unchanging.  Work-up reveals a normal EKG, normal laboratory studies, negative head CT.  I am uncertain as to the etiology of the patient's symptoms, however I feel referral to neurology to consider EEG and further evaluation is indicated.  This referral will be made.  While patient in the ER, he he complains of multiple other issues regarding his respiratory rate, numbness in his hands, back spasms.  His sister also expresses multiple concerns, however nothing today appears emergent or in need of hospitalization.  Again, I will make  referral to neurology for further evaluation of the symptoms.  To return as needed if he worsens in the meantime.  Patient given a work excuse for the next 3 days.    ED/Hospital notes reviewed.   Social History:  Recruitment consultant;  Swing shifts Family history:  Mom with stroke, obesity.  Dad with DM  ROS:   Constitutional:  No f/c, No night sweats, No unexplained weight loss. EENT:  No vision changes, No blurry vision, No hearing changes. No mouth, throat, or ear problems.  Respiratory: No cough, No SOB Cardiac: No CP, no palpitations GI:  No abd pain, No N/V/D. GU: No Urinary s/sx Musculoskeletal: No joint pain Neuro: No headache, no dizziness, some motor weakness.  Skin: No rash Endocrine:  No polydipsia. No polyuria.  Psych: Denies SI/HI  No problems updated.  ALLERGIES: No Known Allergies  PAST MEDICAL HISTORY: Past Medical History:  Diagnosis Date  . Seizures (HCC)     MEDICATIONS AT HOME: Prior to Admission medications   Medication Sig Start Date End Date Taking? Authorizing Provider  ibuprofen (ADVIL,MOTRIN) 600 MG tablet Take 1 tablet (600 mg total) by mouth every 6 (six) hours as needed. Patient not taking: Reported on 10/02/2019 07/09/18   Elson Areas, PA-C  methocarbamol (ROBAXIN) 500 MG tablet Take 1 tablet (500 mg total) by mouth  2 (two) times daily. Patient not taking: Reported on 10/02/2019 07/09/18   Elson Areas, PA-C     Objective:  EXAM:   Vitals:   10/08/20 0933  BP: 122/86  Pulse: 63  Temp: (!) 96.6 F (35.9 C)  TempSrc: Temporal  SpO2: 99%  Weight: 187 lb 6.4 oz (85 kg)  Height: 6' 0.5" (1.842 m)    General appearance : A&OX3. NAD. Non-toxic-appearing HEENT: Atraumatic and Normocephalic.  PERRLA. EOM intact.   Neck: supple, no JVD. No cervical lymphadenopathy. No thyromegaly Chest/Lungs:  Breathing-non-labored, Good air entry bilaterally, breath sounds normal without rales, rhonchi, or wheezing  CVS: S1 S2 regular, no murmurs,  gallops, rubs  Abdomen: Bowel sounds present, Non tender and not distended with no gaurding, rigidity or rebound. Extremities: Bilateral Lower Ext shows no edema, both legs are warm to touch with = pulse throughout Neurology:  CN II-XII grossly intact, Non focal.  Facial symmetry intact.  B U/Lextremity with comparable strength and ROM.  Normal heel to shin Psych:  TP hard to follow. J/I fair. Normal speech. Appropriate eye contact and depressed affect. He gives limited answers to the specific questions I ask and does not directly answer most questions Skin:  No Rash  Data Review No results found for: HGBA1C   Assessment & Plan   1. Seizure-like activity [facility]) Refer neurology.  To ED if any new/different episodes.  Patient verbalizes understanding - Thyroid Panel With TSH - Comprehensive metabolic panel  2. Encounter for examination following treatment at hospital  3. Shifting sleep-work schedule Sleep hygiene and self-care discussed  4. Other fatigue - Thyroid Panel With TSH - Vitamin D, 25-hydroxy  5. Muscle spasm - Thyroid Panel With TSH - Vitamin D, 25-hydroxy - Magnesium   Patient have been counseled extensively about nutrition and exercise  Return in about 2 months (around 12/08/2020) for assign PCP.  The patient was given clear instructions to go to ER or return to medical center if symptoms don't improve, worsen or new problems develop. The patient verbalized understanding. The patient was told to call to get lab results if they haven't heard anything in the next week.     Georgian Co, PA-C [facility] [place], [place] [phone]   10/08/2020, 9:56 AM

## 2020-10-07 NOTE — Telephone Encounter (Signed)
Copied from CRM [id number]. Topic: Appointment Scheduling - Scheduling Inquiry for Clinic >> Oct 07, 2020  1:49 PM Marylen Ponto wrote: Reason for CRM: Pt is upset that his appt was changed to a virtual visit. Pt stated he had a seizure and he has called out of work to be able to make the appt. Pt requests call back.

## 2020-10-07 NOTE — Telephone Encounter (Signed)
Spoke to provider and stated patient be seen in person. Pt. Is aware appt. Changed to in person.

## 2020-10-08 ENCOUNTER — Other Ambulatory Visit: Payer: Self-pay

## 2020-10-08 ENCOUNTER — Ambulatory Visit: Payer: Self-pay | Attending: Physician Assistant | Admitting: Physician Assistant

## 2020-10-08 ENCOUNTER — Encounter: Payer: Self-pay | Admitting: Physician Assistant

## 2020-10-08 VITALS — BP 122/86 | HR 63 | Temp 96.6°F | Ht 72.5 in | Wt 187.4 lb

## 2020-10-08 DIAGNOSIS — G4726 Circadian rhythm sleep disorder, shift work type: Secondary | ICD-10-CM

## 2020-10-08 DIAGNOSIS — R5383 Other fatigue: Secondary | ICD-10-CM

## 2020-10-08 DIAGNOSIS — Z09 Encounter for follow-up examination after completed treatment for conditions other than malignant neoplasm: Secondary | ICD-10-CM

## 2020-10-08 DIAGNOSIS — M62838 Other muscle spasm: Secondary | ICD-10-CM

## 2020-10-08 DIAGNOSIS — R569 Unspecified convulsions: Secondary | ICD-10-CM

## 2020-10-08 NOTE — Patient Instructions (Signed)
Muscle Cramps and Spasms Muscle cramps and spasms are when muscles tighten by themselves. They usually get better within minutes. Muscle cramps are painful. They are usually stronger and last longer than muscle spasms. Muscle spasms may or may not be painful. They can last a few seconds or much longer. Cramps and spasms can affect any muscle, but they occur most often in the calf muscles of the leg. They are usually not caused by a serious problem. In many cases, the cause is not known. Some common causes include:  Doing more physical work or exercise than your body is ready for.  Using the muscles too much (overuse) by repeating certain movements too many times.  Staying in a certain position for a long time.  Playing a sport or doing an activity without preparing properly.  Using bad form or technique while playing a sport or doing an activity.  Not having enough water in your body (dehydration).  Injury.  Side effects of some medicines.  Low levels of the salts and minerals in your blood (electrolytes), such as low potassium or calcium. Follow these instructions at home: Managing pain and stiffness      Massage, stretch, and relax the muscle. Do this for many minutes at a time.  If told, put heat on tight or tense muscles as often as told by your doctor. Use the heat source that your doctor recommends, such as a moist heat pack or a heating pad. ? Place a towel between your skin and the heat source. ? Leave the heat on for 20-30 minutes. ? Remove the heat if your skin turns bright red. This is very important if you are not able to feel pain, heat, or cold. You may have a greater risk of getting burned.  If told, put ice on the affected area. This may help if you are sore or have pain after a cramp or spasm. ? Put ice in a plastic bag. ? Place a towel between your skin and the bag. ? Leave the ice on for 20 minutes, 2-3 times a day.  Try taking hot showers or baths to help  relax tight muscles. Eating and drinking  Drink enough fluid to keep your pee (urine) pale yellow.  Eat a healthy diet to help ensure that your muscles work well. This should include: ? Fruits and vegetables. ? Lean protein. ? Whole grains. ? Low-fat or nonfat dairy products. General instructions  If you are having cramps often, avoid intense exercise for several days.  Take over-the-counter and prescription medicines only as told by your doctor.  Watch for any changes in your symptoms.  Keep all follow-up visits as told by your doctor. This is important. Contact a doctor if:  Your cramps or spasms get worse or happen more often.  Your cramps or spasms do not get better with time. Summary  Muscle cramps and spasms are when muscles tighten by themselves. They usually get better within minutes.  Cramps and spasms occur most often in the calf muscles of the leg.  Massage, stretch, and relax the muscle. This may help the cramp or spasm go away.  Drink enough fluid to keep your pee (urine) pale yellow. This information is not intended to replace advice given to you by your health care provider. Make sure you discuss any questions you have with your health care provider. Document Revised: 04/09/2018 Document Reviewed: 04/09/2018 Elsevier Patient Education  2020 [employer]  

## 2020-10-09 ENCOUNTER — Encounter: Payer: Self-pay | Admitting: Physician Assistant

## 2020-10-09 ENCOUNTER — Other Ambulatory Visit: Payer: Self-pay | Admitting: Physician Assistant

## 2020-10-09 LAB — COMPREHENSIVE METABOLIC PANEL
ALT: 10 IU/L (ref 0–44)
AST: 24 IU/L (ref 0–40)
Albumin/Globulin Ratio: 1.7 (ref 1.2–2.2)
Albumin: 4.7 g/dL (ref 4.0–5.0)
Alkaline Phosphatase: 57 IU/L (ref 44–121)
BUN/Creatinine Ratio: 10 (ref 9–20)
BUN: 11 mg/dL (ref 6–20)
Bilirubin Total: 1.8 mg/dL — ABNORMAL HIGH (ref 0.0–1.2)
CO2: 21 mmol/L (ref 20–29)
Calcium: 9.5 mg/dL (ref 8.7–10.2)
Chloride: 101 mmol/L (ref 96–106)
Creatinine, Ser: 1.12 mg/dL (ref 0.76–1.27)
GFR calc Af Amer: 100 mL/min/{1.73_m2} (ref >59)
GFR calc non Af Amer: 86 mL/min/{1.73_m2} (ref >59)
Globulin, Total: 2.7 g/dL (ref 1.5–4.5)
Glucose: 85 mg/dL (ref 65–99)
Potassium: 4.5 mmol/L (ref 3.5–5.2)
Sodium: 138 mmol/L (ref 134–144)
Total Protein: 7.4 g/dL (ref 6.0–8.5)

## 2020-10-09 LAB — THYROID PANEL WITH TSH
Free Thyroxine Index: 1.2 (ref 1.2–4.9)
T3 Uptake Ratio: 27 % (ref 24–39)
T4, Total: 4.3 ug/dL — ABNORMAL LOW (ref 4.5–12.0)
TSH: 1.49 u[IU]/mL (ref 0.450–4.500)

## 2020-10-09 LAB — VITAMIN D 25 HYDROXY (VIT D DEFICIENCY, FRACTURES): Vit D, 25-Hydroxy: 13.3 ng/mL — ABNORMAL LOW (ref 30.0–100.0)

## 2020-10-09 LAB — MAGNESIUM: Magnesium: 1.8 mg/dL (ref 1.6–2.3)

## 2020-10-09 MED ORDER — VITAMIN D (ERGOCALCIFEROL) 1.25 MG (50000 UNIT) PO CAPS: 50000.0000 [IU] | ORAL_CAPSULE | ORAL | 0 refills | Status: AC

## 2020-12-17 ENCOUNTER — Ambulatory Visit: Payer: Self-pay | Admitting: Family Medicine

## 2020-12-17 ENCOUNTER — Ambulatory Visit: Payer: Self-pay | Admitting: Physician Assistant

## 2021-01-01 ENCOUNTER — Ambulatory Visit: Payer: Self-pay | Admitting: Neurology

## 2022-05-17 IMAGING — CT CT HEAD W/O CM
3 series · 16 of 47 positions shown, 19 images · non-contrast
Comparison: None.

CLINICAL DATA: Mental status changes of unknown etiology. Question
seizure.

EXAM:
CT HEAD WITHOUT CONTRAST
TECHNIQUE: Contiguous axial images were obtained from the base of the skull
through the vertex without intravenous contrast.

[Series 2: head wo · axial · 0.45mm/px · z∈[-102,+33]mm · 10 of 33 slices shown, 13 images]
[im 3/33  brain]
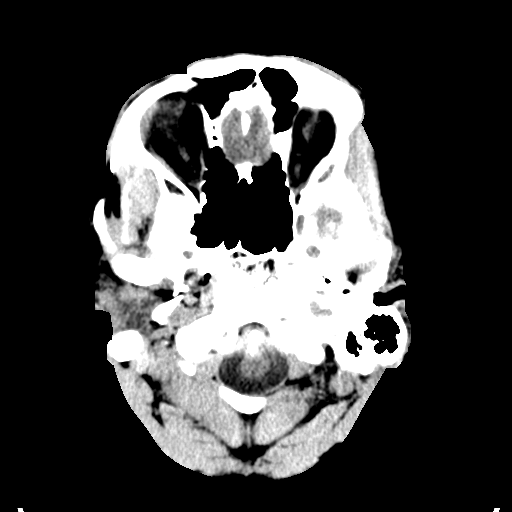
[im 3/33  bone]
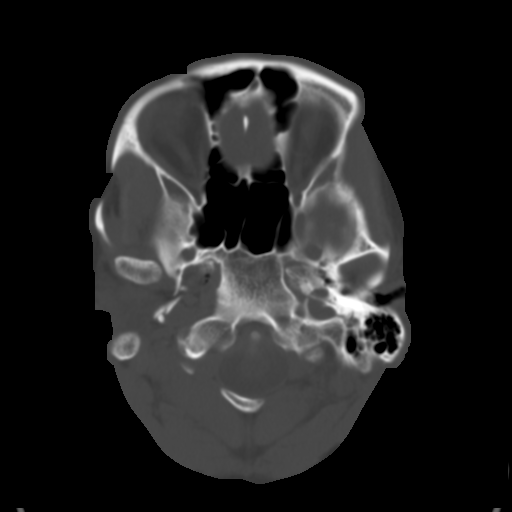
[im 6/33  brain]
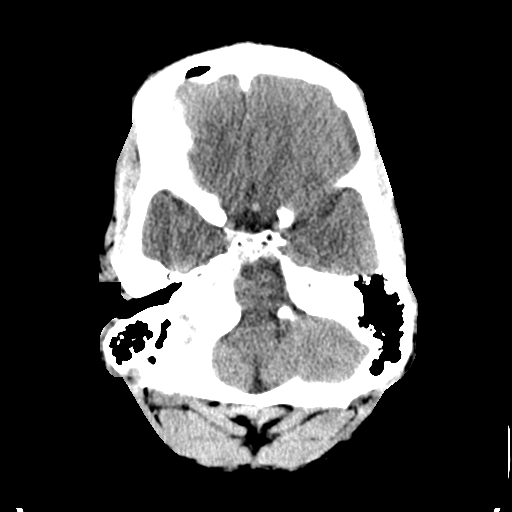
[im 9/33  brain]
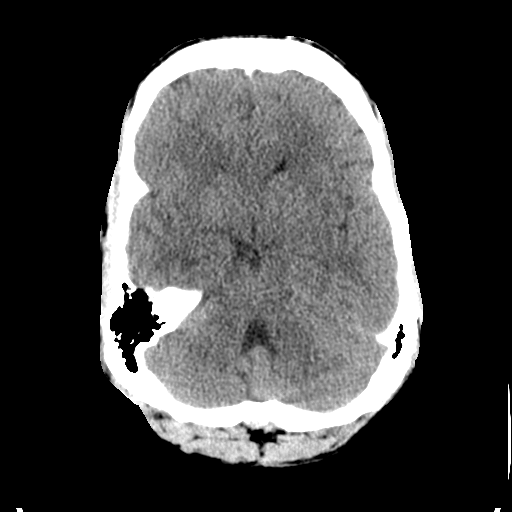
[im 12/33  brain]
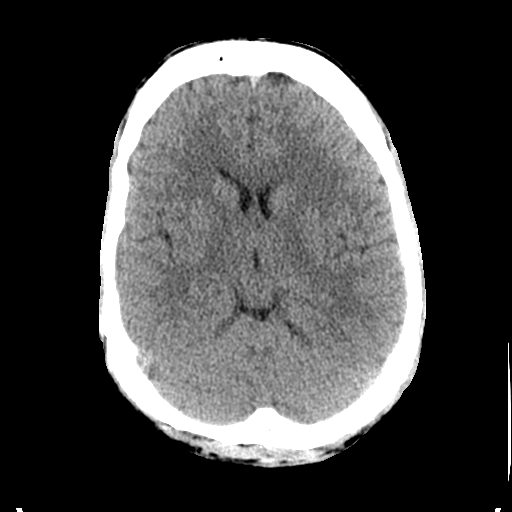
[im 15/33  brain]
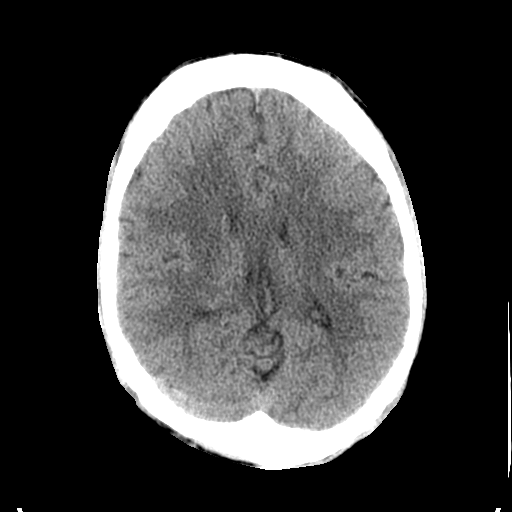
[im 15/33  bone]
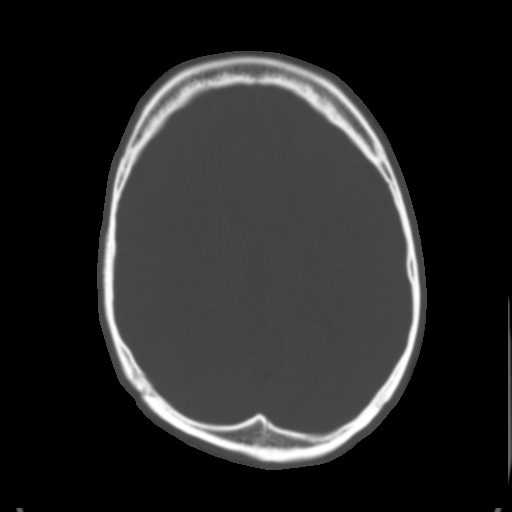
[im 18/33  brain]
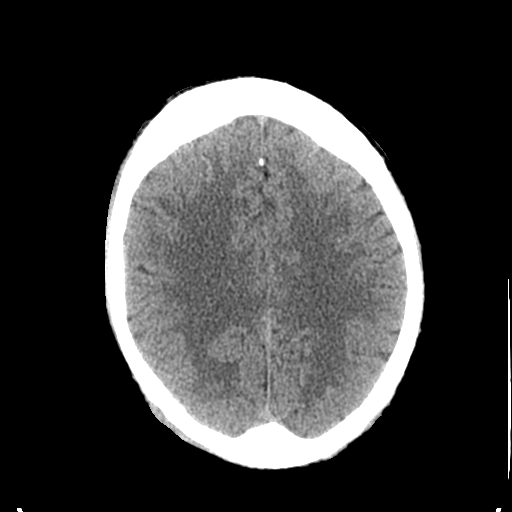
[im 21/33  brain]
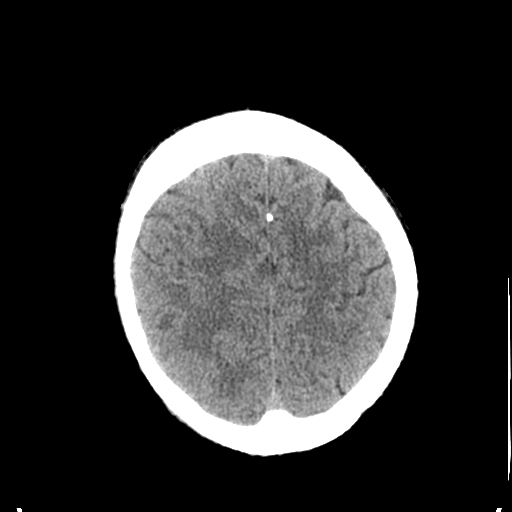
[im 25/33  brain]
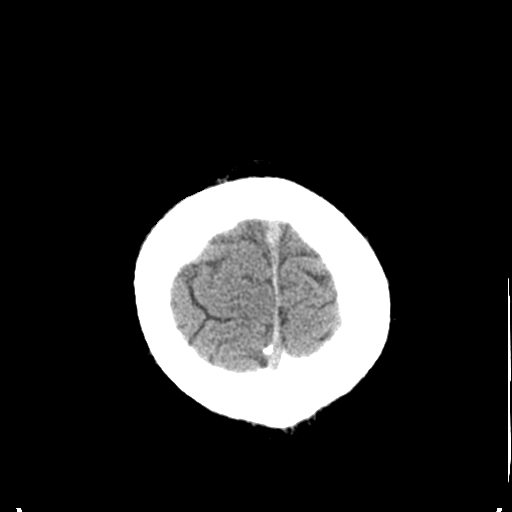
[im 27/33  brain]
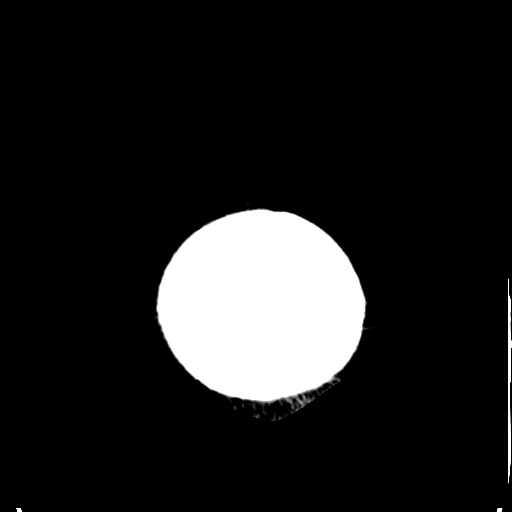
[im 27/33  bone]
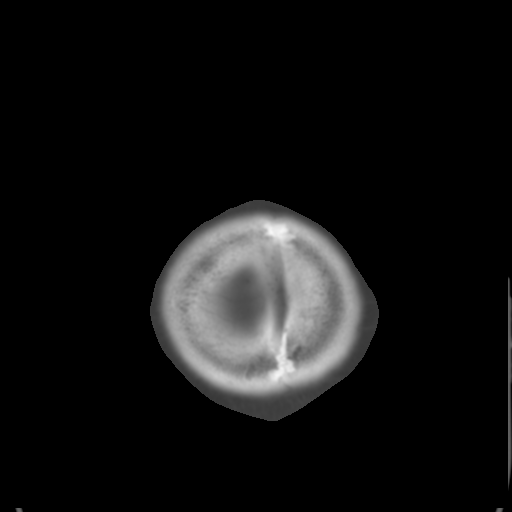
[im 30/33  brain]
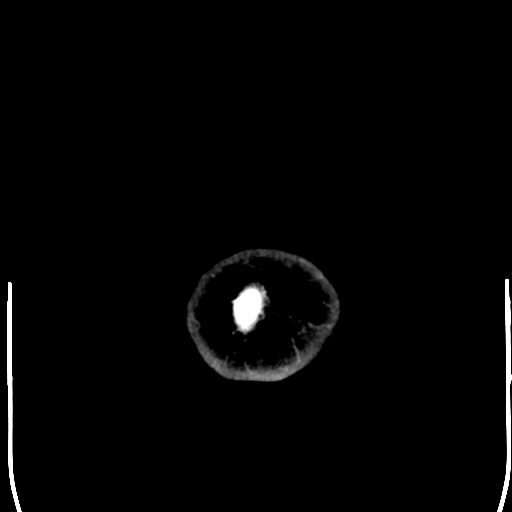

[Series 5: coronal soft tissue · coronal · 0.36mm/px · 3 of 78 slices shown]
[im 26/78  brain]
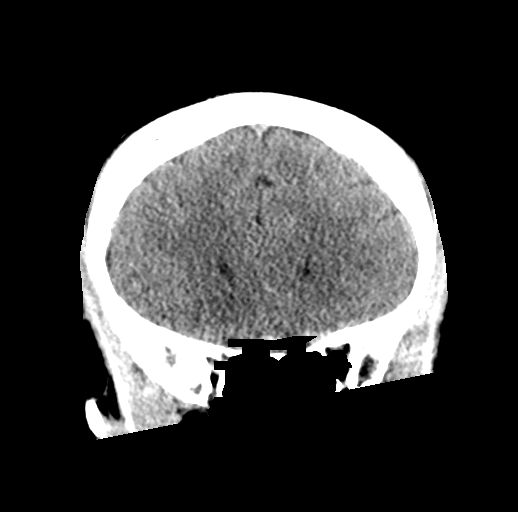
[im 35/78  brain]
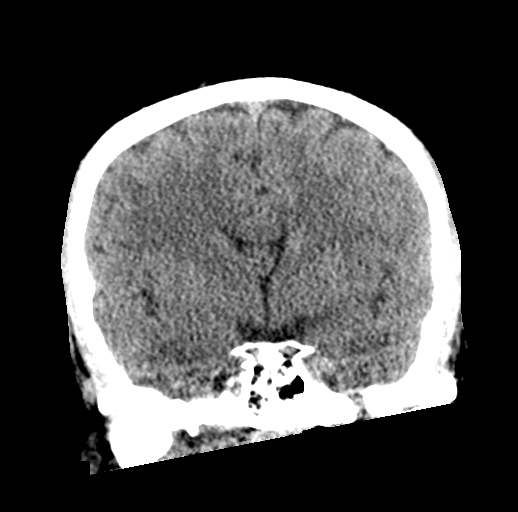
[im 43/78  brain]
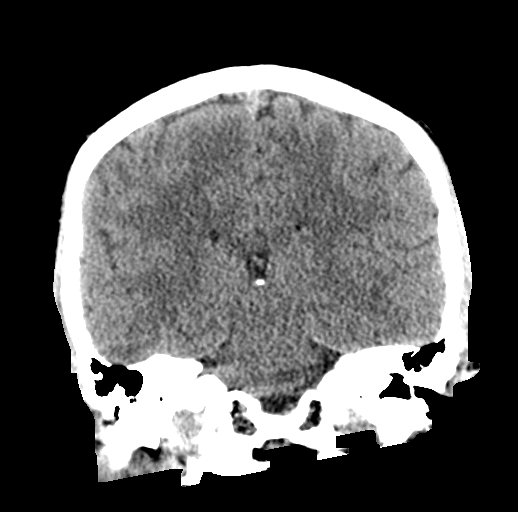

[Series 6: sagittal soft tissue · sagittal · 0.38mm/px · 3 of 65 slices shown]
[im 22/65  brain]
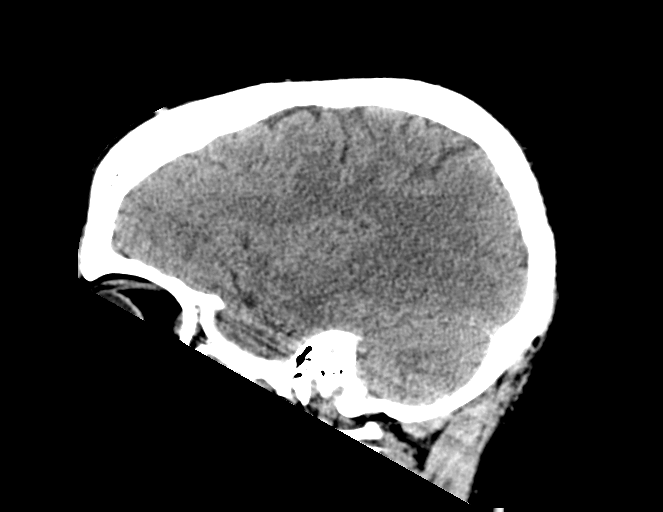
[im 33/65  brain]
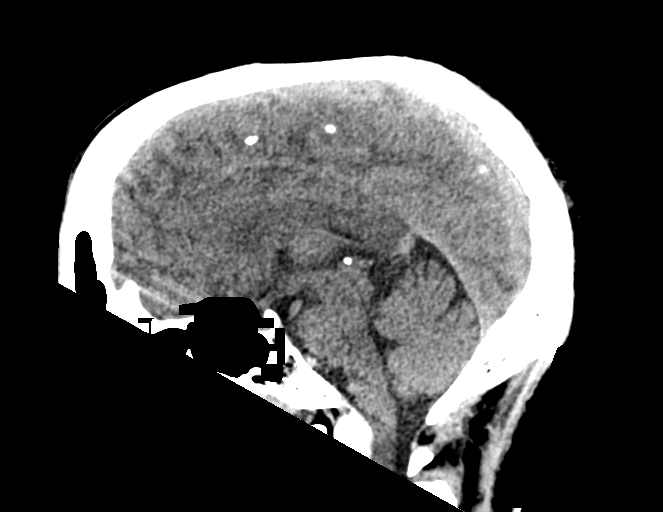
[im 43/65  brain]
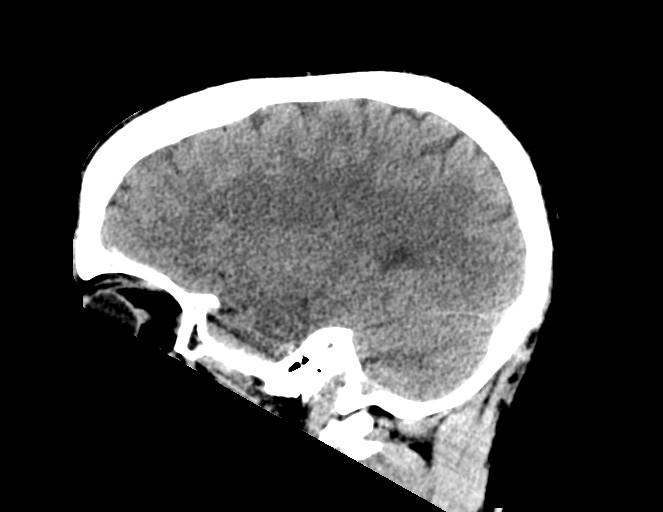

[16 of 47 positions shown; findings below may reference images not displayed]

FINDINGS: Brain: The brain shows a normal appearance without evidence of
malformation, atrophy, old or acute small or large vessel
infarction, mass lesion, hemorrhage, hydrocephalus or extra-axial
collection.

Vascular: No hyperdense vessel. No evidence of atherosclerotic
calcification.

Skull: Normal.  No traumatic finding.  No focal bone lesion.

Sinuses/Orbits: Sinuses are clear. Orbits appear normal. Mastoids
are clear.

Other: None significant
IMPRESSION: Normal head CT.
